# Patient Record
Sex: Female | Born: 1950 | Race: White | Hispanic: No | Marital: Married | Smoking: Never smoker
Health system: Southern US, Community
[De-identification: ages and names within clinical notes are randomized; demographics above are authoritative.]

## PROBLEM LIST (undated history)

## (undated) DIAGNOSIS — T4145XA Adverse effect of unspecified anesthetic, initial encounter: Secondary | ICD-10-CM

## (undated) DIAGNOSIS — J189 Pneumonia, unspecified organism: Secondary | ICD-10-CM

## (undated) DIAGNOSIS — I499 Cardiac arrhythmia, unspecified: Secondary | ICD-10-CM

## (undated) DIAGNOSIS — H04123 Dry eye syndrome of bilateral lacrimal glands: Secondary | ICD-10-CM

## (undated) DIAGNOSIS — G4725 Circadian rhythm sleep disorder, jet lag type: Secondary | ICD-10-CM

## (undated) DIAGNOSIS — I4891 Unspecified atrial fibrillation: Secondary | ICD-10-CM

## (undated) HISTORY — PX: TONSILLECTOMY: SUR1361

## (undated) HISTORY — DX: Unspecified atrial fibrillation: I48.91

## (undated) HISTORY — PX: LITHOTRIPSY: SUR834

## (undated) HISTORY — DX: Circadian rhythm sleep disorder, jet lag type: G47.25

---

## 1997-09-03 ENCOUNTER — Encounter: Admission: RE | Admit: 1997-09-03 | Discharge: 1997-09-03 | Payer: Self-pay | Admitting: Family Medicine

## 1998-01-28 ENCOUNTER — Encounter: Admission: RE | Admit: 1998-01-28 | Discharge: 1998-01-28 | Payer: Self-pay | Admitting: Family Medicine

## 2001-09-12 ENCOUNTER — Other Ambulatory Visit: Admission: RE | Admit: 2001-09-12 | Discharge: 2001-09-12 | Payer: Self-pay | Admitting: Obstetrics and Gynecology

## 2002-02-01 DIAGNOSIS — T8859XA Other complications of anesthesia, initial encounter: Secondary | ICD-10-CM

## 2002-02-01 HISTORY — DX: Other complications of anesthesia, initial encounter: T88.59XA

## 2002-09-18 ENCOUNTER — Other Ambulatory Visit: Admission: RE | Admit: 2002-09-18 | Discharge: 2002-09-18 | Payer: Self-pay | Admitting: Obstetrics and Gynecology

## 2003-09-26 ENCOUNTER — Other Ambulatory Visit: Admission: RE | Admit: 2003-09-26 | Discharge: 2003-09-26 | Payer: Self-pay | Admitting: Obstetrics and Gynecology

## 2004-10-14 ENCOUNTER — Other Ambulatory Visit: Admission: RE | Admit: 2004-10-14 | Discharge: 2004-10-14 | Payer: Self-pay | Admitting: Obstetrics and Gynecology

## 2005-10-20 ENCOUNTER — Other Ambulatory Visit: Admission: RE | Admit: 2005-10-20 | Discharge: 2005-10-20 | Payer: Self-pay | Admitting: Obstetrics and Gynecology

## 2006-08-11 ENCOUNTER — Ambulatory Visit: Payer: Self-pay | Admitting: Sports Medicine

## 2008-10-04 ENCOUNTER — Ambulatory Visit: Payer: Self-pay | Admitting: Family Medicine

## 2008-10-04 DIAGNOSIS — G4725 Circadian rhythm sleep disorder, jet lag type: Secondary | ICD-10-CM

## 2008-10-04 HISTORY — DX: Circadian rhythm sleep disorder, jet lag type: G47.25

## 2009-09-21 ENCOUNTER — Emergency Department (HOSPITAL_COMMUNITY): Admission: EM | Admit: 2009-09-21 | Discharge: 2009-09-21 | Payer: Self-pay | Admitting: Family Medicine

## 2009-12-01 ENCOUNTER — Encounter (INDEPENDENT_AMBULATORY_CARE_PROVIDER_SITE_OTHER): Payer: Self-pay | Admitting: *Deleted

## 2009-12-04 ENCOUNTER — Telehealth: Payer: Self-pay | Admitting: Gastroenterology

## 2010-01-02 ENCOUNTER — Encounter (INDEPENDENT_AMBULATORY_CARE_PROVIDER_SITE_OTHER): Payer: Self-pay | Admitting: *Deleted

## 2010-01-06 ENCOUNTER — Ambulatory Visit: Payer: Self-pay | Admitting: Gastroenterology

## 2010-01-20 ENCOUNTER — Ambulatory Visit: Payer: Self-pay | Admitting: Gastroenterology

## 2010-03-03 NOTE — Letter (Signed)
Summary: Pre Visit Letter Revised  [facility]  [address] [place], [place] [address]   Phone: [phone]  Fax: [phone]        12/01/2009 MRN: [id number] Yvonne Wheeler [address] [place], [place]  [address]             Procedure Date:  01-20-10   Welcome to the Gastroenterology Division at [facility].    You are scheduled to see a nurse for your pre-procedure visit on 01-06-10 at 1:30P.M. on the 3rd floor at [facility], [address]  We ask that you try to arrive at our office 15 minutes prior to your appointment time to allow for check-in.  Please take a minute to review the attached form.  If you answer "Yes" to one or more of the questions on the first page, we ask that you call the person listed at your earliest opportunity.  If you answer "No" to all of the questions, please complete the rest of the form and bring it to your appointment.    Your nurse visit will consist of discussing your medical and surgical history, your immediate family medical history, and your medications.   If you are unable to list all of your medications on the form, please bring the medication bottles to your appointment and we will list them.  We will need to be aware of both prescribed and over the counter drugs.  We will need to know exact dosage information as well.    Please be prepared to read and sign documents such as consent forms, a financial agreement, and acknowledgement forms.  If necessary, and with your consent, a friend or relative is welcome to sit-in on the nurse visit with you.  Please bring your insurance card so that we may make a copy of it.  If your insurance requires a referral to see a specialist, please bring your referral form from your primary care physician.  No co-pay is required for this nurse visit.     If you cannot keep your appointment, please call [phone] to cancel or reschedule prior to your appointment date.  This allows [place]  the opportunity to schedule an appointment for another patient in need of care.    Thank you for choosing [facility] for your medical needs.  We appreciate the opportunity to care for you.  Please visit [place] at our website  to learn more about our practice.  Sincerely, The Gastroenterology Division

## 2010-03-03 NOTE — Assessment & Plan Note (Signed)
Summary: cpe,tcb   Vital Signs:  Patient profile:   60 year old female Height:      65.5 inches Weight:      173.9 pounds BMI:     28.60 Pulse rate:   88 / minute BP sitting:   120 / 70  (left arm)  Vitals Entered By: Arlyss Repress CMA, (October 04, 2008 3:58 PM) CC: physical.meet new doctor.  last T-dap 08-11-2006. last pap 10-10 (goes to gyn office) request RX for motion sickness and sleeping. going on a trip [name] Is Patient Diabetic? No Pain Assessment Patient in pain? no        CC:  physical.meet new doctor.  last T-dap 08-11-2006. last pap 10-10 (goes to gyn office) request RX for motion sickness and sleeping. going on a trip [name].  History of Present Illness: Yvonne Wheeler comes in today to discuss anticipated need of anti-emetics for upcoming airline travel, as well as jet lag.  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  She has motion sickness and gets sick on planes and boats/ships.  She notes that otc dramamine sometimes does not suffice.  She denies fear of flying or panic.  She also is concerned about the jet lag. Will have one day in [name] during layover before flying to [name].  Wants something to help her sleep on the plane to [name].   Has not had colon cancer screening. Gets PAP and mammography done with her Gyn, [name].  Last PAP Oct 2009, scheduled to go back for gyn visit this October. Mammogram in July, scheduled for another one in Jan 2011.  Family Hx: No family hx breast or colon cancer.   ROS: No change in bowel habits, no blood or mucus in stool. No weight loss or fevers/chills.    Habits & Providers  Alcohol-Tobacco-Diet     Tobacco Status: never  Social History: Smoking Status:  never  Physical Exam  General:  Well-developed,well-nourished,in no acute distress; alert,appropriate and cooperative throughout examination Ears:   External ear exam shows no significant lesions or deformities.  Otoscopic examination reveals clear canals, tympanic membranes are intact bilaterally without bulging, retraction, inflammation or discharge. Hearing is grossly normal bilaterally. Mouth:  Oral mucosa and oropharynx without lesions or exudates.  Teeth in good repair. Neck:  No deformities, masses, or tenderness noted. Lungs:  Normal respiratory effort, chest expands symmetrically. Lungs are clear to auscultation, no crackles or wheezes. Heart:  Normal rate and regular rhythm. S1 and S2 normal without gallop, murmur, click, rub or other extra sounds.   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 Also, motion sickness.  She may apply a scopolamine patch before leaving, then remove at end of flight.  If despite the patch she has trouble initiating sleep, she may use Halcion to help initiate sleep on the plane.   Orders: [facility]- Est Level  3 ([address])  Problem # 2:  HEALTH SCREENING (ICD-V70.0) Discussed and recommended colon cancer screening.  She agrees she needs to do this, is a Engineer, site with [employer] and plans tomake her own appt during Christmas break in December.  She is to come in for fasting lipids when convenient to her.  Future Orders: Lipid-[facility] ([address]-10272) ... 10/03/2009  Complete Medication List: 1)  Meclizine Hcl 25 Mg Tabs (Meclizine  hcl) .... Sig: take 1 tab by mouth every 6 hours as needed for nausea 2)  Halcion 0.25 Mg Tabs (Triazolam) .... Sig: take 1 tab by mouth at bedtime as needed for insomnia  Patient Instructions: 1)  It was a pleasure to meet you today.  2)  For your sleep issues, I am refilling your Restoril 15mg  tablets; try to wean from 30mg  to 15mg  at night to initiate sleep.  3)  If you awaken during the night, you may take a Sonata 5mg  capsule to help you go back to sleep.  4)   You may benefit from Cognitive Behavioral Therapy (CBT) to help control your sleep pattern better without medication.  5)  Please return the Release of Information form with Dr. Earlene Plater' contact information, so that we may get labs and prior office notes from your doctor in [place].  6)  I would like to know how your regimen is going; Please call the office to discuss, or make an appointment to follow up in the coming month. Prescriptions: HALCION 0.25 MG TABS (TRIAZOLAM) SIG: Take 1 tab by mouth at bedtime as needed for insomnia  #6 x 0   Entered and Authorized by:   Paula Compton MD   Signed by:   Paula Compton MD on 10/04/2008   Method used:   Print then Give to Patient   RxID:   [id number] MECLIZINE HCL 25 MG TABS (MECLIZINE HCL) SIG: Take 1 tab by mouth every 6 hours as needed for nausea  #6 x 0   Entered and Authorized by:   Paula Compton MD   Signed by:   Paula Compton MD on 10/04/2008   Method used:   Electronically to        [facility] (retail)       [address]       [place], [place]  [address]       Ph: [phone] or [phone]       Fax: [phone]   RxID:   [id number]

## 2010-03-03 NOTE — Assessment & Plan Note (Signed)
Summary: np inactive husband sees Jaden Abreu health assessment form wp   Vital Signs:  Patient Profile:   60 Years Old Female Height:     65.5 inches Weight:      168 pounds BMI:     27.63 Pulse rate:   97 / minute BP sitting:   129 / 73  Pt. in pain?   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 slightly overweight Head:     Normocephalic and atraumatic without obvious abnormalities. No apparent alopecia or balding. Lungs:     Normal respiratory effort, chest expands symmetrically. Lungs are clear to auscultation, no crackles or wheezes. Heart:     Normal rate and regular rhythm. S1 and S2 normal without gallop, murmur, click, rub or other extra sounds. Msk:     no joint swelling and good movemtent and ROM of all joints    Impression & Recommendations:  Problem # 1:  Preventive Health Care (ICD-V70.0) Assessment: New encouraged more regular physical activity - shoot for 30 mins 5 days per week of aerobic acticity  school work form was completed witjh no medical limitations noted.  Other Orders: Tdap => 31yrs IM ([address]) Admin 1st Vaccine ([address]) [facility]- Est Level  2 ([address])         Tetanus/Td Vaccine    Vaccine Type: Tdap    Site: left deltoid    Mfr: sanofi pasteur    Dose: 0.5 ml    Route: IM    Given by: Arlyss Repress CMA,    Exp. Date: 10/11/2008    Lot #: [id number]    VIS given: 08/12/04 version given August 11, 2006.

## 2010-03-03 NOTE — Letter (Signed)
Summary: Moviprep Instructions  [facility]  [address]   [place], [place] [address]   Phone: [phone]  Fax: [phone]       Yvonne Wheeler    1950/11/16    MRN: [id number]        Procedure Day Dorna Bloom: Tuesday, 01-20-10     Arrival Time: 7:30 a.m.      Procedure Time: 8:30 a.m.     Location of Procedure:                      x  [facility] (4th Floor)                        PREPARATION FOR COLONOSCOPY WITH MOVIPREP   Starting 5 days prior to your procedure 01-15-10 do not eat nuts, seeds, popcorn, corn, beans, peas,  salads, or any raw vegetables.  Do not take any fiber supplements (e.g. Metamucil, Citrucel, and Benefiber).  THE DAY BEFORE YOUR PROCEDURE         DATE: 01-19-10   DAY: Monday  1.  Drink clear liquids the entire day-NO SOLID FOOD  2.  Do not drink anything colored red or purple.  Avoid juices with pulp.  No orange juice.  3.  Drink at least 64 oz. (8 glasses) of fluid/clear liquids during the day to prevent dehydration and help the prep work efficiently.  CLEAR LIQUIDS INCLUDE: Water Jello Ice Popsicles Tea (sugar ok, no milk/cream) Powdered fruit flavored drinks Coffee (sugar ok, no milk/cream) Gatorade Juice: apple, white grape, white cranberry  Lemonade Clear bullion, consomm, broth Carbonated beverages (any kind) Strained chicken noodle soup Hard Candy                             4.  In the morning, mix first dose of MoviPrep solution:    Empty 1 Pouch A and 1 Pouch B into the disposable container    Add lukewarm drinking water to the top line of the container. Mix to dissolve    Refrigerate (mixed solution should be used within 24 hrs)  5.  Begin drinking the prep at 5:00 p.m. The MoviPrep container is divided by 4 marks.   Every 15 minutes drink the solution down to the next mark (approximately 8 oz) until the full liter is complete.   6.  Follow completed prep with 16 oz of clear liquid of your choice  (Nothing red or purple).  Continue to drink clear liquids until bedtime.  7.  Before going to bed, mix second dose of MoviPrep solution:    Empty 1 Pouch A and 1 Pouch B into the disposable container    Add lukewarm drinking water to the top line of the container. Mix to dissolve    Refrigerate  THE DAY OF YOUR PROCEDURE      DATE: 01-20-10  DAY: Tuesday  Beginning at 3:30 a.m. (5 hours before procedure):         1. Every 15 minutes, drink the solution down to the next mark (approx 8 oz) until the full liter is complete.  2. Follow completed prep with 16 oz. of clear liquid of your choice.    3. You may drink clear liquids until 6:30 a.m. (2 HOURS BEFORE PROCEDURE).   MEDICATION INSTRUCTIONS  Unless otherwise instructed, you should take regular prescription medications with a small sip of water   as early as  possible the morning of your procedure.           OTHER INSTRUCTIONS  You will need a responsible adult at least 59 years of age to accompany you and drive you home.   This person must remain in the waiting room during your procedure.  Wear loose fitting clothing that is easily removed.  Leave jewelry and other valuables at home.  However, you may wish to bring a book to read or  an iPod/MP3 player to listen to music as you wait for your procedure to start.  Remove all body piercing jewelry and leave at home.  Total time from sign-in until discharge is approximately 2-3 hours.  You should go home directly after your procedure and rest.  You can resume normal activities the  day after your procedure.  The day of your procedure you should not:   Drive   Make legal decisions   Operate machinery   Drink alcohol   Return to work  You will receive specific instructions about eating, activities and medications before you leave.    The above instructions have been reviewed and explained to me by   Karl Bales RN  January 06, 2010 2:04 PM    I  fully understand and can verbalize these instructions _____________________________ Date _________

## 2010-03-03 NOTE — Miscellaneous (Signed)
Summary: [facility] previsit  Clinical Lists Changes  Medications: Added new medication of MOVIPREP 100 GM  SOLR (PEG-KCL-NACL-NASULF-NA ASC-C) As per prep instructions. - Signed Rx of MOVIPREP 100 GM  SOLR (PEG-KCL-NACL-NASULF-NA ASC-C) As per prep instructions.;  #1 x 0;  Signed;  Entered by: Karl Bales RN;  Authorized by: Meryl Dare MD [facility];  Method used: Electron[id number] [address] [place], [place]  [address], Ph: [phone] or [phone], Fax: [phone] Allergies: Added new allergy or adverse reaction of CODEINE Added new allergy or adverse reaction of SULFA Observations: Added new observation of NKA: F (01/06/2010 13:16)    Prescriptions: MOVIPREP 100 GM  SOLR (PEG-KCL-NACL-NASULF-NA ASC-C) As per prep instructions.  #1 x 0   Entered by:   Karl Bales RN   Authorized by:   Meryl Dare MD [facility]   Signed by:   Karl Bales RN on 01/06/2010   Method used:   Electronically to        [facility] ([facility]       [address]       [place], [place]  [address]       Ph: [phone] or [phone]       Fax: [phone]   RxID:   [id number]

## 2010-03-03 NOTE — Progress Notes (Signed)
Summary: previsit letter ?'s  Phone Note Call from Patient Call back at Home Phone [phone]   Caller: Patient Call For: Dr. Russella Dar Reason for Call: Talk to Nurse Summary of Call: previsit letter ?'s Initial call taken by: Vallarie Mare,  December 04, 2009 8:32 AM  Follow-up for Phone Call        Pt states she answered yes to question about being sensitive or allergic to anesthesia. Pt states she had lithotripsy done years ago and cannot remember what sedation she had for this but her husband told her that she was really hard to wake up after the procedure. She states she did not have a allergic reaction to the medicine but felt tired even the next day after the procedure. Told pt to let nurse know at previsit so they can make a note of it on her chart the day of her procedure but she can still have the procedure directly. Pt agreed and states she will tell the previsit nurse before the procedure.  Follow-up by: Christie Nottingham CMA Duncan Dull),  December 04, 2009 1:56 PM

## 2010-03-05 NOTE — Procedures (Signed)
Summary: Colonoscopy  Patient: [name] Note: All result statuses are Final unless otherwise noted.  Tests: (1) Colonoscopy (COL)   COL Colonoscopy           DONE     [facility]     [address]     [place], [place]  [address]           COLONOSCOPY PROCEDURE REPORT           PATIENT:  Yvonne Wheeler, Yvonne Wheeler  MR#:  [id number]     BIRTHDATE:  Jun 22, 1950, 59 yrs. old  GENDER:  female     ENDOSCOPIST:  Judie Petit T. Russella Dar, MD, [facility]     Referred by:  Huel Cote, M.D.     PROCEDURE DATE:  01/20/2010     PROCEDURE:  Colonoscopy [address]     ASA CLASS:  Class II     INDICATIONS:  1) Routine Risk Screening     MEDICATIONS:   Fentanyl 50 mcg IV, Versed 7 mg IV     DESCRIPTION OF PROCEDURE:   After the risks benefits and     alternatives of the procedure were thoroughly explained, informed     consent was obtained.  Digital rectal exam was performed and     revealed no abnormalities.   The LB [id number] endoscope     was introduced through the anus and advanced to the cecum, which     was identified by both the appendix and ileocecal valve, without     limitations.  The quality of the prep was excellent, using     MoviPrep.  The instrument was then slowly withdrawn as the colon     was fully examined.     <<PROCEDUREIMAGES>>     FINDINGS:  A normal appearing cecum, ileocecal valve, and     appendiceal orifice were identified. The ascending, hepatic     flexure, transverse, splenic flexure, descending, sigmoid colon,     and rectum appeared unremarkable.   Retroflexed views in the     rectum revealed no abnormalities.  The time to cecum =  1.67     minutes. The scope was then withdrawn (time =  9  min) from the     patient and the procedure completed.           COMPLICATIONS:  None           ENDOSCOPIC IMPRESSION:     1) Normal colon           RECOMMENDATIONS:     1) Continue current colorectal screening for "routine risk"     patients with a repeat colonoscopy in 10  years.           Venita Lick. Russella Dar, MD, [facility]           n.     eSIGNED:   Venita Lick. Stark at 01/20/2010 08:59 AM           Dalia Heading, [phone]  Note: An exclamation mark (!) indicates a result that was not dispersed into the flowsheet. Document Creation Date: 01/20/2010 9:00 AM _______________________________________________________________________  (1) Order result status: Final Collection or observation date-time: 01/20/2010 08:55 Requested date-time:  Receipt date-time:  Reported date-time:  Referring Physician:   Ordering Physician: Claudette Head [phone]) Specimen Source:  Source: Launa Grill Order Number: [id number] Lab site:   Appended Document: Colonoscopy    Clinical Lists Changes  Observations: Added new observation of COLONNXTDUE: 01/2020 (01/20/2010 9:03)

## 2010-03-27 ENCOUNTER — Encounter: Payer: Self-pay | Admitting: *Deleted

## 2010-04-17 LAB — POCT URINALYSIS DIPSTICK
Bilirubin Urine: NEGATIVE
Glucose, UA: NEGATIVE mg/dL
Ketones, ur: NEGATIVE mg/dL
Nitrite: NEGATIVE
Protein, ur: NEGATIVE mg/dL
Specific Gravity, Urine: 1.01 (ref 1.005–1.030)
Urobilinogen, UA: 0.2 mg/dL (ref 0.0–1.0)
pH: 6.5 (ref 5.0–8.0)

## 2011-04-24 ENCOUNTER — Encounter (HOSPITAL_COMMUNITY): Payer: Self-pay | Admitting: Emergency Medicine

## 2011-04-24 ENCOUNTER — Emergency Department (HOSPITAL_COMMUNITY)
Admission: EM | Admit: 2011-04-24 | Discharge: 2011-04-24 | Disposition: A | Discharge status: Home / Self Care | Payer: BC Managed Care – PPO | Attending: Emergency Medicine | Admitting: Emergency Medicine

## 2011-04-24 DIAGNOSIS — H109 Unspecified conjunctivitis: Secondary | ICD-10-CM

## 2011-04-24 HISTORY — DX: Dry eye syndrome of bilateral lacrimal glands: H04.123

## 2011-04-24 MED ORDER — ERYTHROMYCIN 5 MG/GM OP OINT: TOPICAL_OINTMENT | Freq: Three times a day (TID) | OPHTHALMIC | Status: AC

## 2011-04-24 MED ORDER — ERYTHROMYCIN 5 MG/GM OP OINT
TOPICAL_OINTMENT | Freq: Three times a day (TID) | OPHTHALMIC | Status: DC
  Administered 2011-04-24: 23:00:00 via OPHTHALMIC
  Filled 2011-04-24: qty 3.5

## 2011-04-24 NOTE — ED Notes (Addendum)
Pt with chronic dry eyes presents to ED with redness to right sclera, yellow/green exudate that started today; slightly blurred vision in right eye

## 2011-04-24 NOTE — ED Provider Notes (Signed)
History     CSN: [id number]  Arrival date & time 04/24/11  2024   First MD Initiated Contact with Patient 04/24/11 2217      Chief Complaint  Patient presents with  . Eye Problem    (Consider location/radiation/quality/duration/timing/severity/associated sxs/prior treatment) HPI Comments: Patient here with right eye redness and drainage with mild pain - states no photophobia - no history of anyone else with infection.  Patient with a history of dry eyes who is on restasis - states has had a remote history of infection in the eye in the past.  See opthamology for the dry eyes.  Patient is a 61 y.o. female presenting with eye problem. The history is provided by the patient. No language interpreter was used.  Eye Problem  This is a new problem. The current episode started 6 to 12 hours ago. The problem occurs constantly. The problem has not changed since onset.There is pain in the right eye. The pain is at a severity of 3/10. The pain is mild. There is no history of trauma to the eye. There is no known exposure to pink eye. She does not wear contacts. Associated symptoms include discharge, eye redness and itching. Pertinent negatives include no numbness, no blurred vision, no decreased vision, no double vision, no foreign body sensation, no photophobia, no nausea, no vomiting, no tingling and no weakness. She has tried nothing for the symptoms. The treatment provided no relief.    Past Medical History  Diagnosis Date  . Dry eyes     Past Surgical History  Procedure Date  . Tonsillectomy     No family history on file.  History  Substance Use Topics  . Smoking status: Never Smoker   . Smokeless tobacco: Not on file  . Alcohol Use: Yes     occassional    OB History    Grav Para Term Preterm Abortions TAB SAB Ect Mult Living                  Review of Systems  Eyes: Positive for pain, discharge, redness and itching. Negative for blurred vision, double vision, photophobia and  visual disturbance.  Gastrointestinal: Negative for nausea and vomiting.  Skin: Positive for itching.  Neurological: Negative for tingling, weakness and numbness.  All other systems reviewed and are negative.    Allergies  Codeine and Sulfonamide derivatives  Home Medications   Current Outpatient Rx  Name Route Sig Dispense Refill  . CYCLOSPORINE 0.05 % OP EMUL Both Eyes Place 1 drop into both eyes every 12 (twelve) hours.    . ESTRADIOL 25 MCG VA TABS Vaginal Place 25 mcg vaginally 2 (two) times a week.    . IBUPROFEN 200 MG PO TABS Oral Take 400 mg by mouth every 6 (six) hours as needed. For pain    . ADULT MULTIVITAMIN W/MINERALS CH Oral Take 1 tablet by mouth daily.      BP 144/77  Pulse 98  Temp(Src) 98.8 F (37.1 C) (Oral)  Resp 18  SpO2 100%  Physical Exam  Nursing note and vitals reviewed. Constitutional: She is oriented to person, place, and time. She appears well-developed and well-nourished. No distress.  HENT:  Head: Normocephalic and atraumatic.  Right Ear: External ear normal.  Left Ear: External ear normal.  Nose: Nose normal.  Mouth/Throat: Oropharynx is clear and moist. No oropharyngeal exudate.  Eyes: EOM are normal. Pupils are equal, round, and reactive to light. Right eye exhibits discharge. Left eye exhibits no discharge.  No scleral icterus.  Neck: Normal range of motion. Neck supple.  Cardiovascular: Normal rate, regular rhythm and normal heart sounds.  Exam reveals no gallop and no friction rub.   No murmur heard. Pulmonary/Chest: Effort normal and breath sounds normal. No respiratory distress. She exhibits no tenderness.  Abdominal: Soft. Bowel sounds are normal. She exhibits no distension. There is no tenderness.  Musculoskeletal: Normal range of motion. She exhibits no edema and no tenderness.  Lymphadenopathy:    She has no cervical adenopathy.  Neurological: She is alert and oriented to person, place, and time. No cranial nerve deficit.    Skin: Skin is warm and dry. No rash noted. No erythema. No pallor.  Psychiatric: She has a normal mood and affect. Her behavior is normal. Judgment and thought content normal.    ED Course  Procedures (including critical care time)  Labs Reviewed - No data to display No results found.   Conjunctivitis    MDM  Patient with purulent drainage and crusting noted to the right conjunctiva - no pain except with gentle palpation of the eye - no fever, chills.  Has been on erythromycin in the past and will place her on this again.  She will follow up with her opthamologist.        Scarlette Calico C. [place], [place] 04/24/11 2303

## 2011-04-24 NOTE — ED Provider Notes (Signed)
Medical screening examination/treatment/procedure(s) were performed by non-physician practitioner and as supervising physician I was immediately available for consultation/collaboration.   Rolan Bucco, MD 04/24/11 [phone]

## 2011-04-24 NOTE — Discharge Instructions (Signed)
Conjunctivitis Conjunctivitis is commonly called "pink eye." Conjunctivitis can be caused by bacterial or viral infection, allergies, or injuries. There is usually redness of the lining of the eye, itching, discomfort, and sometimes discharge. There may be deposits of matter along the eyelids. A viral infection usually causes a watery discharge, while a bacterial infection causes a yellowish, thick discharge. Pink eye is very contagious and spreads by direct contact. You may be given antibiotic eyedrops as part of your treatment. Before using your eye medicine, remove all drainage from the eye by washing gently with warm water and cotton balls. Continue to use the medication until you have awakened 2 mornings in a row without discharge from the eye. Do not rub your eye. This increases the irritation and helps spread infection. Use separate towels from other household members. Wash your hands with soap and water before and after touching your eyes. Use cold compresses to reduce pain and sunglasses to relieve irritation from light. Do not wear contact lenses or wear eye makeup until the infection is gone. SEEK MEDICAL CARE IF:   Your symptoms are not better after 3 days of treatment.   You have increased pain or trouble seeing.   The outer eyelids become very red or swollen.  Document Released: 02/26/2004 Document Revised: 01/07/2011 Document Reviewed: 01/18/2005 Exit[employer] Patient Information 2012 [employer]. 

## 2012-07-17 ENCOUNTER — Ambulatory Visit (INDEPENDENT_AMBULATORY_CARE_PROVIDER_SITE_OTHER): Payer: BC Managed Care – PPO | Admitting: Sports Medicine

## 2012-07-17 ENCOUNTER — Ambulatory Visit
Admission: RE | Admit: 2012-07-17 | Discharge: 2012-07-17 | Disposition: A | Discharge status: Home / Self Care | Payer: BC Managed Care – PPO | Source: Ambulatory Visit | Attending: Sports Medicine | Admitting: Sports Medicine

## 2012-07-17 ENCOUNTER — Encounter: Payer: Self-pay | Admitting: Sports Medicine

## 2012-07-17 ENCOUNTER — Other Ambulatory Visit: Payer: Self-pay | Admitting: Sports Medicine

## 2012-07-17 VITALS — BP 114/77 | HR 97 | Ht 65.5 in | Wt 190.0 lb

## 2012-07-17 DIAGNOSIS — M25569 Pain in unspecified knee: Secondary | ICD-10-CM

## 2012-07-17 DIAGNOSIS — M25561 Pain in right knee: Secondary | ICD-10-CM

## 2012-07-17 NOTE — Progress Notes (Signed)
  Subjective:    Patient ID: Yvonne Wheeler, female    DOB: Dec 23, 1950, 62 y.o.   MRN: [id number]  HPI chief complaint: Right knee pain  Very pleasant 62 year old female comes in today complaining of about 6 weeks of right knee pain. She initially injured the knee while squatting and twisting. Felt a twinge at the time but developed pain and swelling the following day. Pain is sharp in quality and localized to the anterior medial knee. It is worse with weightbearing, particularly with twisting type motions. Denies prior problems with his knee. No prior knee surgeries. She gets painful popping. Pain improves slightly with over-the-counter anti-inflammatories and ice. She is concerned because she has a trip to [place] planned for this fall.  Past medical history and current medications are reviewed Allergies are reviewed Socially she does not smoke, drinks alcohol on an occasional basis, and works as a Geologist, engineering at [employer]     Review of Systems     Objective:   Physical Exam Well-developed, well-nourished. No acute distress. Awake alert and oriented x3. Vital signs are reviewed  Right knee: Range of motion 0-120. Trace to 1+ effusion. She is tender to palpation along the medial joint line with a positive McMurrays and a positive Thessaly's. Minimal patellofemoral crepitus. Good ligamentous stability. Neurovascularly intact distally. Walking with a slight limp.       Assessment & Plan:  1. Right knee pain likely secondary to medial meniscal tear  AP, lateral, and sunrise views of the right knee. MRI of the right knee specifically to rule out a medial meniscal tear which may need arthroscopic debridement. Continue with over-the-counter anti-inflammatories and ice until I reviewed her MRI We will delineate further treatment based on that study.

## 2012-07-17 NOTE — Patient Instructions (Addendum)
You have been scheduled for a MRI of your right knee on 07/19/12 at 12:15pm.  Please arrive at 11:45am to register. [facility] [address] [phone]

## 2012-07-19 ENCOUNTER — Ambulatory Visit
Admission: RE | Admit: 2012-07-19 | Discharge: 2012-07-19 | Disposition: A | Discharge status: Home / Self Care | Payer: BC Managed Care – PPO | Source: Ambulatory Visit | Attending: Sports Medicine | Admitting: Sports Medicine

## 2012-07-19 DIAGNOSIS — M25561 Pain in right knee: Secondary | ICD-10-CM

## 2012-07-21 ENCOUNTER — Telehealth: Payer: Self-pay | Admitting: Sports Medicine

## 2012-07-21 ENCOUNTER — Telehealth: Payer: Self-pay | Admitting: *Deleted

## 2012-07-21 NOTE — Telephone Encounter (Signed)
Message copied by Mora Bellman on Fri Jul 21, 2012  9:20 AM ------      Message from: Ralene Cork      Created: Fri Jul 21, 2012  8:34 AM      Regarding: referral       Please refer to Dr. Thurston Hole for a medial meniscal tear.            ----- Message -----         From: Rad Results In Interface         Sent: 07/19/2012   1:31 PM           To: Ralene Cork, DO                   ------

## 2012-07-21 NOTE — Telephone Encounter (Signed)
Scheduled pt for appt 07/25/12 at 10 am with Dr. Thurston Hole.  Pt notified of appt info.

## 2012-07-21 NOTE — Telephone Encounter (Signed)
I spoke with the patient on the phone regarding MRI findings of her right knee. MRI shows a moderate size tear to the posterior horn of the medial meniscus and a moderate-sized joint effusion. She has mild to moderate degenerative changes. Given her swelling and mechanical symptoms I recommended referral to Dr. Thurston Hole to discuss further treatment options. He very well may start with a cortisone injection but she has a trip planned for this fall and she would be interested in arthroscopy if he feels like that is warranted. I'll defer further treatment to the discretion of Dr. Thurston Hole. Followup with me when necessary.

## 2012-08-01 HISTORY — PX: MENISCUS REPAIR: SHX5179

## 2012-08-31 ENCOUNTER — Encounter (INDEPENDENT_AMBULATORY_CARE_PROVIDER_SITE_OTHER): Payer: BC Managed Care – PPO

## 2012-08-31 DIAGNOSIS — M79609 Pain in unspecified limb: Secondary | ICD-10-CM

## 2012-08-31 DIAGNOSIS — R609 Edema, unspecified: Secondary | ICD-10-CM

## 2012-08-31 DIAGNOSIS — M7989 Other specified soft tissue disorders: Secondary | ICD-10-CM

## 2012-09-14 ENCOUNTER — Other Ambulatory Visit: Payer: Self-pay | Admitting: Orthopedic Surgery

## 2012-09-14 ENCOUNTER — Ambulatory Visit
Admission: RE | Admit: 2012-09-14 | Discharge: 2012-09-14 | Disposition: A | Discharge status: Home / Self Care | Payer: BC Managed Care – PPO | Source: Ambulatory Visit | Attending: Orthopedic Surgery | Admitting: Orthopedic Surgery

## 2012-09-14 DIAGNOSIS — R059 Cough, unspecified: Secondary | ICD-10-CM

## 2012-09-14 DIAGNOSIS — R05 Cough: Secondary | ICD-10-CM

## 2012-09-14 DIAGNOSIS — R0602 Shortness of breath: Secondary | ICD-10-CM

## 2012-09-14 MED ORDER — IOHEXOL 350 MG/ML SOLN
125.0000 mL | Freq: Once | INTRAVENOUS | Status: AC | PRN
  Administered 2012-09-14: 125 mL via INTRAVENOUS

## 2012-09-28 ENCOUNTER — Other Ambulatory Visit: Payer: Self-pay | Admitting: Family Medicine

## 2012-09-28 DIAGNOSIS — J189 Pneumonia, unspecified organism: Secondary | ICD-10-CM

## 2012-10-02 DIAGNOSIS — J189 Pneumonia, unspecified organism: Secondary | ICD-10-CM

## 2012-10-02 HISTORY — DX: Pneumonia, unspecified organism: J18.9

## 2012-10-04 ENCOUNTER — Ambulatory Visit
Admission: RE | Admit: 2012-10-04 | Discharge: 2012-10-04 | Disposition: A | Discharge status: Home / Self Care | Payer: BC Managed Care – PPO | Source: Ambulatory Visit | Attending: Family Medicine | Admitting: Family Medicine

## 2012-10-04 DIAGNOSIS — J189 Pneumonia, unspecified organism: Secondary | ICD-10-CM

## 2012-10-09 ENCOUNTER — Other Ambulatory Visit: Payer: Self-pay | Admitting: Family Medicine

## 2012-10-09 DIAGNOSIS — I82409 Acute embolism and thrombosis of unspecified deep veins of unspecified lower extremity: Secondary | ICD-10-CM

## 2012-10-11 ENCOUNTER — Ambulatory Visit
Admission: RE | Admit: 2012-10-11 | Discharge: 2012-10-11 | Disposition: A | Discharge status: Home / Self Care | Payer: BC Managed Care – PPO | Source: Ambulatory Visit | Attending: Family Medicine | Admitting: Family Medicine

## 2012-10-11 DIAGNOSIS — I82409 Acute embolism and thrombosis of unspecified deep veins of unspecified lower extremity: Secondary | ICD-10-CM

## 2012-10-16 ENCOUNTER — Other Ambulatory Visit: Payer: Self-pay | Admitting: Cardiology

## 2012-10-17 NOTE — H&P (Signed)
Patient: Yvonne Wheeler, Yvonne Wheeler Account Number: [id number] Provider: Donato Schultz, MD  DOB: 10/19/1950 Age: 62 Y Sex: Female Date: 10/13/2012  Phone: [phone]   Address: [address], [place], [place]  Pcp: [name] MILLER          1. REF DR Hyacinth Meeker EVALUATE NEW ONSET AFIB.        HPI:  General:  62 year old here to evaluate new onset atrial fibrillation. She is traveling to [place] on 10/24/12. Had recent knee surgery and a DVT. CT scan was performed to exclude pulmonary embolism. During followup exam, on 09/25/12, an EKG was performed which demonstrated atrial fibrillation. Rate 108 noted. On Xarelto 20mg  now. Been on Xarelto since July 31 st. More than one month. Walking PNA. Finished another round of abx. Cough. Mild SOB.  Since Dr. Hyacinth Meeker told her about AFIB she can feel it skipping. Prior did not notice it. No CP. No thyroid history. No tob. Very mild EtOH. .        ROS:  Denies any fevers, chills, orthopnea, PND, rashes, strokelike symptoms, bleeding, chest pain. Unless specified above, all other review of systems negative.       Medical History: DVT after surgery Wainer 08/31/12 R peroneal vein Xarelto, 3 mos , nl u/s 10/11/12, Xarelto until 12/01/12 , colonoscopy 03/2009 Dr. Corinda [place] normal, Pneumonia on CT 09/14/12 lat inf R middle lobe, needs repeat CT chest without contrast , A fib found at OV 09/25/12, Obesity.        Gyn History:  Last pap smear date 03/2012 Dr. Senaida Ores, normal.  Last mammogram date 09/2012 [facility], normal.  Other: bone density 09/2011 [facility], normal.        Surgical History: tonsillectomy , lithotripsy , 2 live births , torn meniscus (one month ago) .        Family History: Father: deceased, old ageMother: deceased, old ageBrother 1: 59 yrs, none knownChildren: two children 6, 24 no issuesNon-Contributory       Social History:  General:  Tobacco use  cigarettes: Never smoked no Smoking.  no Tobacco Exposure.  Alcohol: yes, 2-3 per month.  Caffeine:  yes.  no Recreational drug use.  no Diet.  Exercise: yes.  Occupation: retired Runner, broadcasting/film/video.        Medications: Taking Restasis 0.05 % Emulsion 1 into affected eye Twice a day, Taking Multi For Her Tablet , Taking Tylenol 500 MG/15ML Liquid 1 tablet as needed every 6 hrs, Taking Ambien 10 MG Tablet 1 tablet at bedtime Once a day prn insomnia, Taking Metoprolol Succinate 25 MG Tablet Extended Release 24 Hour 1 tablet Once a day, Taking Xarelto 20 mg . tablet 1 tablet once a day, Notes: Dr. Thurston Hole, Discontinued Benzonatate 200 MG Capsule 1 capsule as needed Q 8hrs prn cough, Discontinued Levaquin 750 MG Tablet 1 tablet Once a day, Medication List reviewed and reconciled with the patient       Allergies: Codeine (for allergy): vomiting for 3 days, Sulfa Drugs: rash.           Vitals: Wt 194.8, Wt change -1.6 lb, Ht 65.5, BMI 31.92, Pulse sitting 90, BP sitting 134/90.       Examination:  General Examination:  GENERAL APPEARANCE alert, oriented, NAD, pleasant.  SKIN: normal, no rash.  HEENT: normal.  HEAD: [place]/AT.  EYES: EOMI, Conjunctiva clear.  NECK: supple, FROM, without evidence of thyromegaly, adenopathy, or bruits, no jugular venous distention (JVD).  LUNGS: clear to auscultation bilaterally, no wheezes, rhonchi, rales, regular breathing rate and effort.  HEART: Irregularly irregular, no S3, S4, murmur or rub, point of maximul impulse (PMI) normal.  ABDOMEN: soft, non-tender/non-distended, bowel sounds present, no masses palpated, no bruit.  EXTREMITIES: no clubbing, no edema, pulses 2 plus bilaterally.  NEUROLOGIC EXAM: non-focal exam, alert and oriented x 3.  PERIPHERAL PULSES: normal (2+) bilaterally.  LYMPH NODES: no cervical adenopathy.  PSYCH affect normal.  Prior medical records, lab work on EKG reviewed.           Assessment:  1. Atrial fibrillation - 427.31 (Primary)  2. DVT - 453.40  3. Obesity - 278.00  4. Anticoagulant monitoring - V58.61        1. Atrial  fibrillation  LAB: PT (Prothrombin Time) (161096)     Stegall,Amy 10/16/2012 09:21:53 AM > ? McVey,Linda 10/16/2012 11:41:47 AM > scheduled for 10/16/2012   LAB: Basic Metabolic     Stegall,Amy 10/16/2012 09:22:08 AM > ? McVey,Linda 10/16/2012 11:41:20 AM > scheduled for 10/16/2012   LAB: CBC with Diff     Stegall,Amy 10/16/2012 09:21:24 AM > Bonita Quin, does she need labs? if so, call to r/s. McVey,Linda 10/16/2012 11:42:08 AM > scheduled for 10/16/2012    Imaging: EC Echocardiogram (Ordered for 10/13/2012) Notes: New-onset atrial fibrillation/newly discovered. She has been on anticoagulation, Xarelto, for greater than one month ( DVT treatment). I will perform cardioversion to help reestablish normal sinus rhythm. Risks and benefits of procedure have been discussed including pacemaker, worsening arrhythmia. Anesthesia will assist with procedure. There is a chance that atrial fibrillation will return after procedure. She seems to be tolerating the atrial fibrillation well, good rate control. I also explained that not only the surgery but recent bout of pneumonia can contribute to atrial fibrillation. Since she is getting better, no fevers etc, I feel comfortable proceeding with cardioversion at this time. She is very eager to go on her trip to [place]. I think that this would be reasonable. We will check echocardiogram as part of evaluation for newly discovered atrial fibrillation.       2. DVT  Notes: Treated with Xarelto.       3. Obesity  Notes: Continue to encourage weight loss.       4. Anticoagulant monitoring  Notes: It is important for her to continue with her anticoagulation after cardioversion for 4 weeks. Her CHADS-VAS score is one over female. Based upon this, she would not require long-term anticoagulation.        Preventive:   CC: Dr. Sigmund Hazel.       Follow Up: I will followup after cardioversion.

## 2012-10-18 ENCOUNTER — Ambulatory Visit (HOSPITAL_COMMUNITY): Payer: BC Managed Care – PPO | Admitting: *Deleted

## 2012-10-18 ENCOUNTER — Encounter (HOSPITAL_COMMUNITY)
Admission: RE | Disposition: A | Discharge status: Home / Self Care | Payer: Self-pay | Source: Ambulatory Visit | Attending: Cardiology

## 2012-10-18 ENCOUNTER — Encounter (HOSPITAL_COMMUNITY): Payer: Self-pay | Admitting: *Deleted

## 2012-10-18 ENCOUNTER — Ambulatory Visit (HOSPITAL_COMMUNITY)
Admission: RE | Admit: 2012-10-18 | Discharge: 2012-10-18 | Disposition: A | Discharge status: Home / Self Care | Payer: BC Managed Care – PPO | Source: Ambulatory Visit | Attending: Cardiology | Admitting: Cardiology

## 2012-10-18 ENCOUNTER — Encounter (HOSPITAL_COMMUNITY): Payer: Self-pay

## 2012-10-18 DIAGNOSIS — Z86718 Personal history of other venous thrombosis and embolism: Secondary | ICD-10-CM | POA: Insufficient documentation

## 2012-10-18 DIAGNOSIS — E669 Obesity, unspecified: Secondary | ICD-10-CM | POA: Insufficient documentation

## 2012-10-18 DIAGNOSIS — I4891 Unspecified atrial fibrillation: Secondary | ICD-10-CM

## 2012-10-18 DIAGNOSIS — Z7901 Long term (current) use of anticoagulants: Secondary | ICD-10-CM | POA: Insufficient documentation

## 2012-10-18 DIAGNOSIS — I480 Paroxysmal atrial fibrillation: Secondary | ICD-10-CM | POA: Diagnosis present

## 2012-10-18 HISTORY — PX: CARDIOVERSION: SHX1299

## 2012-10-18 HISTORY — DX: Unspecified atrial fibrillation: I48.91

## 2012-10-18 HISTORY — DX: Cardiac arrhythmia, unspecified: I49.9

## 2012-10-18 HISTORY — DX: Adverse effect of unspecified anesthetic, initial encounter: T41.45XA

## 2012-10-18 HISTORY — DX: Pneumonia, unspecified organism: J18.9

## 2012-10-18 SURGERY — CARDIOVERSION
Anesthesia: Monitor Anesthesia Care

## 2012-10-18 MED ORDER — PROPOFOL 10 MG/ML IV BOLUS
INTRAVENOUS | Status: DC | PRN
  Administered 2012-10-18: 60 mg via INTRAVENOUS

## 2012-10-18 MED ORDER — LIDOCAINE HCL (CARDIAC) 20 MG/ML IV SOLN
INTRAVENOUS | Status: DC | PRN
  Administered 2012-10-18: 20 mg via INTRAVENOUS

## 2012-10-18 MED ORDER — SODIUM CHLORIDE 0.9 % IV SOLN
INTRAVENOUS | Status: DC
  Administered 2012-10-18: 11:00:00 via INTRAVENOUS

## 2012-10-18 NOTE — CV Procedure (Signed)
Electrical Cardioversion Procedure Note Yvonne Wheeler [id number] [birth year]  Procedure: Electrical Cardioversion Indications:  Atrial Fibrillation  Time Out: Verified patient identification, verified procedure,medications/allergies/relevent history reviewed, required imaging and test results available.  Performed  Procedure Details  The patient was NPO after midnight. Anesthesia was administered at the beside  by Dr.Joslin with 60mg  of propofol.  Cardioversion was performed with synchronized biphasic defibrillation via AP pads with 150 joules.  1 attempt(s) were performed.  The patient converted to normal sinus rhythm. The patient tolerated the procedure well   IMPRESSION:  Successful cardioversion of atrial fibrillation  Xarelto, continue.   SKAINS, MARK 10/18/2012, 11:52 AM

## 2012-10-18 NOTE — Anesthesia Preprocedure Evaluation (Addendum)
Anesthesia Evaluation  Patient identified by MRN, date of birth, ID band Patient awake    Reviewed: Allergy & Precautions, H&P , NPO status , Patient's Chart, lab work & pertinent test results, reviewed documented beta blocker date and time   Airway Mallampati: II TM Distance: >3 FB Neck ROM: Full    Dental  (+) Teeth Intact and Dental Advisory Given   Pulmonary pneumonia -, resolved,  resolved breath sounds clear to auscultation        Cardiovascular + dysrhythmias Rhythm:Irregular     Neuro/Psych    GI/Hepatic   Endo/Other    Renal/GU      Musculoskeletal   Abdominal   Peds  Hematology   Anesthesia Other Findings   Reproductive/Obstetrics                          Anesthesia Physical Anesthesia Plan  ASA: III  Anesthesia Plan: MAC   Post-op Pain Management:    Induction: Intravenous  Airway Management Planned: Mask  Additional Equipment:   Intra-op Plan:   Post-operative Plan:   Informed Consent: I have reviewed the patients History and Physical, chart, labs and discussed the procedure including the risks, benefits and alternatives for the proposed anesthesia with the patient or authorized representative who has indicated his/her understanding and acceptance.   Dental advisory given  Plan Discussed with: CRNA, Anesthesiologist and Surgeon  Anesthesia Plan Comments:         Anesthesia Quick Evaluation

## 2012-10-18 NOTE — Preoperative (Signed)
Beta Blockers   Reason not to administer Beta Blockers:Not Applicable 

## 2012-10-18 NOTE — Transfer of Care (Signed)
Immediate Anesthesia Transfer of Care Note  Patient: Yvonne Wheeler  Procedure(s) Performed: Procedure(s): CARDIOVERSION (N/A)  Patient Location: Endoscopy Unit  Anesthesia Type:MAC  Level of Consciousness: awake, alert  and oriented  Airway & Oxygen Therapy: Patient Spontanous Breathing and Patient connected to nasal cannula oxygen  Post-op Assessment: Report given to PACU RN and Post -op Vital signs reviewed and stable  Post vital signs: Reviewed and stable  Complications: No apparent anesthesia complications

## 2012-10-18 NOTE — H&P (View-Only) (Signed)
Patient: Yvonne Wheeler, Yvonne Wheeler Account Number: [id number] Provider: Petina Muraski, MD  DOB: 11/13/1950 Age: 62 Y Sex: Female Date: 10/13/2012  Phone: [phone]   Address: [address], , [place]  Pcp: [name] MILLER          1. REF DR MILLER EVALUATE NEW ONSET AFIB.        HPI:  General:  62-year-old here to evaluate new onset atrial fibrillation. She is traveling to [place] on 10/24/12. Had recent knee surgery and a DVT. CT scan was performed to exclude pulmonary embolism. During followup exam, on 09/25/12, an EKG was performed which demonstrated atrial fibrillation. Rate 108 noted. On Xarelto 20mg now. Been on Xarelto since July 31 st. More than one month. Walking PNA. Finished another round of abx. Cough. Mild SOB.  Since Dr. Miller told her about AFIB she can feel it skipping. Prior did not notice it. No CP. No thyroid history. No tob. Very mild EtOH. .        ROS:  Denies any fevers, chills, orthopnea, PND, rashes, strokelike symptoms, bleeding, chest pain. Unless specified above, all other review of systems negative.       Medical History: DVT after surgery Wainer 08/31/12 Wheeler peroneal vein Xarelto, 3 mos , nl u/s 10/11/12, Xarelto until 12/01/12 , colonoscopy 03/2009 Dr. [place] normal, Pneumonia on CT 09/14/12 lat inf Wheeler middle lobe, needs repeat CT chest without contrast , A fib found at OV 09/25/12, Obesity.        Gyn History:  Last pap smear date 03/2012 Dr. Richardson, normal.  Last mammogram date 09/2012 [facility], normal.  Other: bone density 09/2011 [facility], normal.        Surgical History: tonsillectomy , lithotripsy , 2 live births , torn meniscus (one month ago) .        Family History: Father: deceased, old ageMother: deceased, old ageBrother 1: 59 yrs, none knownChildren: two children 32, 24 no issuesNon-Contributory       Social History:  General:  Tobacco use  cigarettes: Never smoked no Smoking.  no Tobacco Exposure.  Alcohol: yes, 2-3 per month.  Caffeine:  yes.  no Recreational drug use.  no Diet.  Exercise: yes.  Occupation: retired teacher.        Medications: Taking Restasis 0.05 % Emulsion 1 into affected eye Twice a day, Taking Multi For Her Tablet , Taking Tylenol 500 MG/15ML Liquid 1 tablet as needed every 6 hrs, Taking Ambien 10 MG Tablet 1 tablet at bedtime Once a day prn insomnia, Taking Metoprolol Succinate 25 MG Tablet Extended Release 24 Hour 1 tablet Once a day, Taking Xarelto 20 mg . tablet 1 tablet once a day, Notes: Dr. Wainer, Discontinued Benzonatate 200 MG Capsule 1 capsule as needed Q 8hrs prn cough, Discontinued Levaquin 750 MG Tablet 1 tablet Once a day, Medication List reviewed and reconciled with the patient       Allergies: Codeine (for allergy): vomiting for 3 days, Sulfa Drugs: rash.           Vitals: Wt 194.8, Wt change -1.6 lb, Ht 65.5, BMI 31.92, Pulse sitting 90, BP sitting 134/90.       Examination:  General Examination:  GENERAL APPEARANCE alert, oriented, NAD, pleasant.  SKIN: normal, no rash.  HEENT: normal.  HEAD: [place]/AT.  EYES: EOMI, Conjunctiva clear.  NECK: supple, FROM, without evidence of thyromegaly, adenopathy, or bruits, no jugular venous distention (JVD).  LUNGS: clear to auscultation bilaterally, no wheezes, rhonchi, rales, regular breathing rate and effort.    HEART: Irregularly irregular, no S3, S4, murmur or rub, point of maximul impulse (PMI) normal.  ABDOMEN: soft, non-tender/non-distended, bowel sounds present, no masses palpated, no bruit.  EXTREMITIES: no clubbing, no edema, pulses 2 plus bilaterally.  NEUROLOGIC EXAM: non-focal exam, alert and oriented x 3.  PERIPHERAL PULSES: normal (2+) bilaterally.  LYMPH NODES: no cervical adenopathy.  PSYCH affect normal.  Prior medical records, lab work on EKG reviewed.           Assessment:  1. Atrial fibrillation - 427.31 (Primary)  2. DVT - 453.40  3. Obesity - 278.00  4. Anticoagulant monitoring - V58.61        1. Atrial  fibrillation  LAB: PT (Prothrombin Time) (005199)     Stegall,Amy 10/16/2012 09:21:53 AM > ? McVey,Linda 10/16/2012 11:41:47 AM > scheduled for 10/16/2012   LAB: Basic Metabolic     Stegall,Amy 10/16/2012 09:22:08 AM > ? McVey,Linda 10/16/2012 11:41:20 AM > scheduled for 10/16/2012   LAB: CBC with Diff     Stegall,Amy 10/16/2012 09:21:24 AM > Linda, does she need labs? if so, call to Wheeler/s. McVey,Linda 10/16/2012 11:42:08 AM > scheduled for 10/16/2012    Imaging: EC Echocardiogram (Ordered for 10/13/2012) Notes: New-onset atrial fibrillation/newly discovered. She has been on anticoagulation, Xarelto, for greater than one month ( DVT treatment). I will perform cardioversion to help reestablish normal sinus rhythm. Risks and benefits of procedure have been discussed including pacemaker, worsening arrhythmia. Anesthesia will assist with procedure. There is a chance that atrial fibrillation will return after procedure. She seems to be tolerating the atrial fibrillation well, good rate control. I also explained that not only the surgery but recent bout of pneumonia can contribute to atrial fibrillation. Since she is getting better, no fevers etc, I feel comfortable proceeding with cardioversion at this time. She is very eager to go on her trip to [place]. I think that this would be reasonable. We will check echocardiogram as part of evaluation for newly discovered atrial fibrillation.       2. DVT  Notes: Treated with Xarelto.       3. Obesity  Notes: Continue to encourage weight loss.       4. Anticoagulant monitoring  Notes: It is important for her to continue with her anticoagulation after cardioversion for 4 weeks. Her CHADS-VAS score is one over female. Based upon this, she would not require long-term anticoagulation.        Preventive:   CC: Dr. [name] Miller.       Follow Up: I will followup after cardioversion.          

## 2012-10-18 NOTE — Interval H&P Note (Signed)
History and Physical Interval Note:  10/18/2012 11:48 AM  Yvonne Wheeler  has presented today for surgery, with the diagnosis of AFIB  The various methods of treatment have been discussed with the patient and family. After consideration of risks, benefits and other options for treatment, the patient has consented to  Procedure(s): CARDIOVERSION (N/A) as a surgical intervention .  The patient's history has been reviewed, patient examined, no change in status, stable for surgery.  I have reviewed the patient's chart and labs.  Questions were answered to the patient's satisfaction.     SKAINS, MARK

## 2012-10-18 NOTE — Anesthesia Postprocedure Evaluation (Signed)
  Anesthesia Post-op Note  Patient: Yvonne Wheeler  Procedure(s) Performed: Procedure(s): CARDIOVERSION (N/A)  Patient Location: Endoscopy Unit  Anesthesia Type:General  Level of Consciousness: awake, alert  and oriented  Airway and Oxygen Therapy: Patient Spontanous Breathing and Patient connected to nasal cannula oxygen  Post-op Pain: mild  Post-op Assessment: Post-op Vital signs reviewed, Patient's Cardiovascular Status Stable and Respiratory Function Stable  Post-op Vital Signs: stable  Complications: No apparent anesthesia complications

## 2012-10-19 ENCOUNTER — Encounter (HOSPITAL_COMMUNITY): Payer: Self-pay | Admitting: Cardiology

## 2012-11-14 ENCOUNTER — Other Ambulatory Visit (HOSPITAL_COMMUNITY): Payer: Self-pay | Admitting: Cardiology

## 2012-11-14 ENCOUNTER — Ambulatory Visit (HOSPITAL_COMMUNITY): Payer: BC Managed Care – PPO | Attending: Cardiology | Admitting: Cardiology

## 2012-11-14 DIAGNOSIS — I369 Nonrheumatic tricuspid valve disorder, unspecified: Secondary | ICD-10-CM

## 2012-11-14 DIAGNOSIS — I4891 Unspecified atrial fibrillation: Secondary | ICD-10-CM

## 2012-11-14 NOTE — Progress Notes (Signed)
Echo performed. 

## 2012-11-15 ENCOUNTER — Telehealth: Payer: Self-pay | Admitting: Cardiology

## 2012-11-15 NOTE — Telephone Encounter (Signed)
New Problem  Pt's husband request to speak with the Dr. Before scheduled appt on 10/22//He is concerned about the way the questions were answered. He wants to be sure that the Dr. Was comfortable with him asking questions. Please call back

## 2012-11-21 NOTE — Telephone Encounter (Signed)
Returned call for the third time ,left message to call back.

## 2012-11-22 ENCOUNTER — Ambulatory Visit: Payer: BC Managed Care – PPO | Admitting: Cardiology

## 2012-12-04 ENCOUNTER — Encounter: Payer: Self-pay | Admitting: Cardiology

## 2012-12-04 ENCOUNTER — Encounter: Payer: Self-pay | Admitting: *Deleted

## 2012-12-04 DIAGNOSIS — I499 Cardiac arrhythmia, unspecified: Secondary | ICD-10-CM | POA: Insufficient documentation

## 2012-12-05 ENCOUNTER — Ambulatory Visit (INDEPENDENT_AMBULATORY_CARE_PROVIDER_SITE_OTHER): Payer: BC Managed Care – PPO | Admitting: Interventional Cardiology

## 2012-12-05 ENCOUNTER — Encounter: Payer: Self-pay | Admitting: Interventional Cardiology

## 2012-12-05 VITALS — BP 112/72 | HR 94 | Ht 66.0 in | Wt 190.0 lb

## 2012-12-05 DIAGNOSIS — Z7901 Long term (current) use of anticoagulants: Secondary | ICD-10-CM | POA: Insufficient documentation

## 2012-12-05 DIAGNOSIS — I4891 Unspecified atrial fibrillation: Secondary | ICD-10-CM

## 2012-12-05 LAB — BASIC METABOLIC PANEL
BUN: 20 mg/dL (ref 6–23)
CO2: 32 mEq/L (ref 19–32)
Calcium: 9.4 mg/dL (ref 8.4–10.5)
Chloride: 100 mEq/L (ref 96–112)
Creatinine, Ser: 0.8 mg/dL (ref 0.4–1.2)
GFR: 73.2 mL/min (ref >60.00)
Glucose, Bld: 93 mg/dL (ref 70–99)
Potassium: 3.9 mEq/L (ref 3.5–5.1)
Sodium: 137 mEq/L (ref 135–145)

## 2012-12-05 MED ORDER — FLECAINIDE ACETATE 50 MG PO TABS: 50.0000 mg | ORAL_TABLET | Freq: Two times a day (BID) | ORAL | Status: DC

## 2012-12-05 MED ORDER — METOPROLOL SUCCINATE ER 25 MG PO TB24: 75.0000 mg | ORAL_TABLET | Freq: Every day | ORAL | Status: DC

## 2012-12-05 MED ORDER — RIVAROXABAN 20 MG PO TABS: 20.0000 mg | ORAL_TABLET | Freq: Every day | ORAL | Status: DC

## 2012-12-05 NOTE — Progress Notes (Signed)
Patient ID: Yvonne Wheeler, female   DOB: November 21, 1950, 62 y.o.   MRN: [id number]   Date: 12/05/2012 ID: Yvonne Wheeler, DOB 1951/01/02, MRN [id number] PCP: Yvonne Barthel, MD  Reason: Atrial fibrillation  ASSESSMENT;  1. Persistent atrial fibrillation, with recurrent arrhythmia after cardioversion September 2014. Exertional dyspnea and fatigue are clinical symptoms. Rare palpitations. Ventricular rate control is poor 2. Chronic anticoagulation,, Xarelto 3. History of DVT 4. Sleep disorder, not sleep apnea but never excluded.  PLAN:  1. Begin flecainide 50 mg twice a day 2. Continues Xarelto 3. Electrical cardioversion in 48 hours as an outpatient. Discussed the procedure with the patient and husband who recall details from the prior procedure. Also discussed referral for EP evaluation prior to cardioversion. We decided that we would give an additional attempt at rhythm control on medication before considering ablation. She would be a good ablation candidate given her normal left atrial size. 4. Optimize flecainide therapy after cardioversion 5. Increase metoprolol to 75 mg daily, to improve ventricular rate control   SUBJECTIVE: Yvonne Wheeler is a 62 y.o. female who is referred for management of atrial fibrillation. She saw my colleague Dr. Anne Fu, who performed electrical cardioversion in September. The cardioversion was successful but she maintained sinus rhythm for less than 3 days. She has maintained therapy with Xarelto, and is here for further discussion and understanding of her problem. She is accompanied by her husband. Compared to 6 months ago her exertional tolerance has significantly decreased. There is more dyspnea with exertion than previously noted. At rest there no complaints. Palpitations are minor complaints. She denies syncope, orthopnea, PND, ankle swelling, and chest pain.  An echocardiogram done recently demonstrated normal LV size with lower normal LV function. There is no  history of coronary disease or prior myocardial infarction.  Current outpatient prescriptions:acetaminophen (TYLENOL) 500 MG tablet, Take 500 mg by mouth every 6 (six) hours as needed., Disp: , Rfl: ;  cycloSPORINE (RESTASIS) 0.05 % ophthalmic emulsion, Place 1 drop into both eyes every 12 (twelve) hours., Disp: , Rfl: ;  metoprolol succinate (TOPROL-XL) 25 MG 24 hr tablet, Take 3 tablets (75 mg total) by mouth daily. Take 2 tablets in the am and 1 Tablet in the pm, Disp: 90 tablet, Rfl: 10 Multiple Vitamin (MULITIVITAMIN WITH MINERALS) TABS, Take 1 tablet by mouth daily., Disp: , Rfl: ;  Rivaroxaban (XARELTO) 20 MG TABS tablet, Take 1 tablet (20 mg total) by mouth daily., Disp: 30 tablet, Rfl: 10;  flecainide (TAMBOCOR) 50 MG tablet, Take 1 tablet (50 mg total) by mouth 2 (two) times daily., Disp: 60 tablet, Rfl: 6  Allergies  Allergen Reactions  . Codeine     REACTION: sick to stomach  . Sulfonamide Derivatives     REACTION: rash   .meds  Past Medical History  Diagnosis Date  . Dry eyes   . Complication of anesthesia 2004    post op nausea/vomiting and difficult to arouse  . Dysrhythmia   . Pneumonia 10/02/2012  . Atrial fibrillation 10/18/2012    Cardioversion - successful. 10/17/12   . 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 Tonsillectomy    . Meniscus repair  08/2012  . Lithotripsy    . Cardioversion N/A 10/18/2012    Procedure: CARDIOVERSION;  Surgeon: Donato Schultz, MD;  Location: [facility] ENDOSCOPY;  Service: Cardiovascular;  Laterality: N/A;    History   Social History  . Marital Status: Married  Spouse Name: N/A    Number of Children: N/A  . Years of Education: N/A   Occupational History  . Not on file.   Social History Main Topics  . Smoking status: Never Smoker   . Smokeless tobacco: Never Used  . Alcohol Use: Yes     Comment: occassional  . Drug Use: No  . Sexual Activity: Not on file   Other  Topics Concern  . Not on file   Social History Narrative  . No narrative on file    History reviewed. No pertinent family history.  ROS: Denies syncope, bleeding, pulmonary embolism, rheumatic heart disease, lung disease, GI complaints, bleeding, and neurological abnormality.. Other systems negative for complaints.  OBJECTIVE: BP 112/72  Pulse 94  Ht 5\' 6"  (1.676 m)  Wt 190 lb (86.183 kg)  BMI 30.68 kg/m2,  General: No acute distress, moderately obese, appearing her stated age HEENT: normal with no evidence of jaundice or excessive pallor. Neck: JVD flat. Carotids irregularly irregular to palpation. No bruit. Chest: Clear. Cardiac: Murmur: None. Gallop: None. Rhythm: Irregularly irregular with faster than controlled ventricular response. Other: Normal Abdomen: Bruit: None. Pulsation: Normal Extremities: Edema: None. Pulses: 2+ and symmetric Neuro: Normal Psych: Normal  ECG: Atrial fibrillation with rapid ventricular response at 94 beats per minute

## 2012-12-05 NOTE — Patient Instructions (Signed)
Start Flecainide 50mg  twice daily and Rx has been sent to your pharmacy  Continue taking Xarelto 20mg  daily  Lab Today: BMET  Your physician has recommended that you have a Cardioversion (DCCV). Electrical Cardioversion uses a jolt of electricity to your heart either through paddles or wired patches attached to your chest. This is a controlled, usually prescheduled, procedure. Defibrillation is done under light anesthesia in the hospital, and you usually go home the day of the procedure. This is done to get your heart back into a normal rhythm. You are not awake for the procedure. Please see the instruction sheet given to you today.

## 2012-12-06 ENCOUNTER — Encounter (HOSPITAL_COMMUNITY): Payer: Self-pay | Admitting: Pharmacist

## 2012-12-06 ENCOUNTER — Other Ambulatory Visit: Payer: Self-pay | Admitting: Interventional Cardiology

## 2012-12-06 DIAGNOSIS — I4891 Unspecified atrial fibrillation: Secondary | ICD-10-CM

## 2012-12-07 ENCOUNTER — Telehealth: Payer: Self-pay | Admitting: Cardiology

## 2012-12-07 ENCOUNTER — Ambulatory Visit (HOSPITAL_COMMUNITY)
Admission: RE | Admit: 2012-12-07 | Discharge: 2012-12-07 | Disposition: A | Discharge status: Home / Self Care | Payer: BC Managed Care – PPO | Source: Ambulatory Visit | Attending: Interventional Cardiology | Admitting: Interventional Cardiology

## 2012-12-07 ENCOUNTER — Other Ambulatory Visit: Payer: Self-pay

## 2012-12-07 ENCOUNTER — Ambulatory Visit (HOSPITAL_COMMUNITY): Payer: BC Managed Care – PPO | Admitting: Anesthesiology

## 2012-12-07 ENCOUNTER — Encounter (HOSPITAL_COMMUNITY): Payer: Self-pay | Admitting: Anesthesiology

## 2012-12-07 ENCOUNTER — Encounter (HOSPITAL_COMMUNITY): Payer: BC Managed Care – PPO | Admitting: Anesthesiology

## 2012-12-07 ENCOUNTER — Encounter (HOSPITAL_COMMUNITY)
Admission: RE | Disposition: A | Discharge status: Home / Self Care | Payer: Self-pay | Source: Ambulatory Visit | Attending: Interventional Cardiology

## 2012-12-07 DIAGNOSIS — I4891 Unspecified atrial fibrillation: Secondary | ICD-10-CM

## 2012-12-07 DIAGNOSIS — Z7901 Long term (current) use of anticoagulants: Secondary | ICD-10-CM | POA: Insufficient documentation

## 2012-12-07 DIAGNOSIS — G4720 Circadian rhythm sleep disorder, unspecified type: Secondary | ICD-10-CM | POA: Insufficient documentation

## 2012-12-07 DIAGNOSIS — Z86718 Personal history of other venous thrombosis and embolism: Secondary | ICD-10-CM | POA: Insufficient documentation

## 2012-12-07 HISTORY — PX: CARDIOVERSION: SHX1299

## 2012-12-07 SURGERY — CARDIOVERSION
Anesthesia: Monitor Anesthesia Care | Wound class: Clean

## 2012-12-07 MED ORDER — LIDOCAINE HCL (CARDIAC) 20 MG/ML IV SOLN
INTRAVENOUS | Status: DC | PRN
  Administered 2012-12-07: 30 mg via INTRAVENOUS

## 2012-12-07 MED ORDER — METOPROLOL SUCCINATE ER 25 MG PO TB24: 25.0000 mg | ORAL_TABLET | Freq: Two times a day (BID) | ORAL | Status: DC

## 2012-12-07 MED ORDER — FLECAINIDE ACETATE 50 MG PO TABS: 75.0000 mg | ORAL_TABLET | Freq: Two times a day (BID) | ORAL | Status: DC

## 2012-12-07 MED ORDER — SODIUM CHLORIDE 0.9 % IV SOLN
INTRAVENOUS | Status: DC | PRN
  Administered 2012-12-07: 13:00:00 via INTRAVENOUS

## 2012-12-07 MED ORDER — PROPOFOL 10 MG/ML IV BOLUS
INTRAVENOUS | Status: DC | PRN
  Administered 2012-12-07: 70 mg via INTRAVENOUS

## 2012-12-07 NOTE — Anesthesia Postprocedure Evaluation (Signed)
  Anesthesia Post-op Note  Patient: Yvonne Wheeler  Procedure(s) Performed: Procedure(s): CARDIOVERSION (N/A)  Patient Location: Short Stay  Anesthesia Type:MAC  Level of Consciousness: awake  Airway and Oxygen Therapy: Patient Spontanous Breathing  Post-op Pain: mild  Post-op Assessment: Post-op Vital signs reviewed  Post-op Vital Signs: Reviewed  Complications: No apparent anesthesia complications

## 2012-12-07 NOTE — Preoperative (Signed)
Beta Blockers   Reason not to administer Beta Blockers:Toprol XL taken this am 12/07/12

## 2012-12-07 NOTE — H&P (View-Only) (Signed)
Patient ID: Yvonne Wheeler, female   DOB: 12/24/1950, 62 y.o.   MRN: [id number]   Date: 12/05/2012 ID: Yvonne Wheeler, DOB 01/29/1951, MRN [id number] PCP: BREEN,JAMES O, MD  Reason: Atrial fibrillation  ASSESSMENT;  1. Persistent atrial fibrillation, with recurrent arrhythmia after cardioversion September 2014. Exertional dyspnea and fatigue are clinical symptoms. Rare palpitations. Ventricular rate control is poor 2. Chronic anticoagulation,, Xarelto 3. History of DVT 4. Sleep disorder, not sleep apnea but never excluded.  PLAN:  1. Begin flecainide 50 mg twice a day 2. Continues Xarelto 3. Electrical cardioversion in 48 hours as an outpatient. Discussed the procedure with the patient and husband who recall details from the prior procedure. Also discussed referral for EP evaluation prior to cardioversion. We decided that we would give an additional attempt at rhythm control on medication before considering ablation. She would be a good ablation candidate given her normal left atrial size. 4. Optimize flecainide therapy after cardioversion 5. Increase metoprolol to 75 mg daily, to improve ventricular rate control   SUBJECTIVE: Yvonne Wheeler is a 62 y.o. female who is referred for management of atrial fibrillation. She saw my colleague Dr. Skains, who performed electrical cardioversion in September. The cardioversion was successful but she maintained sinus rhythm for less than 3 days. She has maintained therapy with Xarelto, and is here for further discussion and understanding of her problem. She is accompanied by her husband. Compared to 6 months ago her exertional tolerance has significantly decreased. There is more dyspnea with exertion than previously noted. At rest there no complaints. Palpitations are minor complaints. She denies syncope, orthopnea, PND, ankle swelling, and chest pain.  An echocardiogram done recently demonstrated normal LV size with lower normal LV function. There is no  history of coronary disease or prior myocardial infarction.  Current outpatient prescriptions:acetaminophen (TYLENOL) 500 MG tablet, Take 500 mg by mouth every 6 (six) hours as needed., Disp: , Rfl: ;  cycloSPORINE (RESTASIS) 0.05 % ophthalmic emulsion, Place 1 drop into both eyes every 12 (twelve) hours., Disp: , Rfl: ;  metoprolol succinate (TOPROL-XL) 25 MG 24 hr tablet, Take 3 tablets (75 mg total) by mouth daily. Take 2 tablets in the am and 1 Tablet in the pm, Disp: 90 tablet, Rfl: 10 Multiple Vitamin (MULITIVITAMIN WITH MINERALS) TABS, Take 1 tablet by mouth daily., Disp: , Rfl: ;  Rivaroxaban (XARELTO) 20 MG TABS tablet, Take 1 tablet (20 mg total) by mouth daily., Disp: 30 tablet, Rfl: 10;  flecainide (TAMBOCOR) 50 MG tablet, Take 1 tablet (50 mg total) by mouth 2 (two) times daily., Disp: 60 tablet, Rfl: 6  Allergies  Allergen Reactions  . Codeine     REACTION: sick to stomach  . Sulfonamide Derivatives     REACTION: rash   .meds  Past Medical History  Diagnosis Date  . Dry eyes   . Complication of anesthesia 2004    post op nausea/vomiting and difficult to arouse  . Dysrhythmia   . Pneumonia 10/02/2012  . Atrial fibrillation 10/18/2012    Cardioversion - successful. 10/17/12   . 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 Tonsillectomy    . Meniscus repair  08/2012  . Lithotripsy    . Cardioversion N/A 10/18/2012    Procedure: CARDIOVERSION;  Surgeon: Mark Skains, MD;  Location: [facility] ENDOSCOPY;  Service: Cardiovascular;  Laterality: N/A;    History   Social History  . Marital Status: Married      Spouse Name: N/A    Number of Children: N/A  . Years of Education: N/A   Occupational History  . Not on file.   Social History Main Topics  . Smoking status: Never Smoker   . Smokeless tobacco: Never Used  . Alcohol Use: Yes     Comment: occassional  . Drug Use: No  . Sexual Activity: Not on file   Other  Topics Concern  . Not on file   Social History Narrative  . No narrative on file    History reviewed. No pertinent family history.  ROS: Denies syncope, bleeding, pulmonary embolism, rheumatic heart disease, lung disease, GI complaints, bleeding, and neurological abnormality.. Other systems negative for complaints.  OBJECTIVE: BP 112/72  Pulse 94  Ht 5' 6" (1.676 m)  Wt 190 lb (86.183 kg)  BMI 30.68 kg/m2,  General: No acute distress, moderately obese, appearing her stated age HEENT: normal with no evidence of jaundice or excessive pallor. Neck: JVD flat. Carotids irregularly irregular to palpation. No bruit. Chest: Clear. Cardiac: Murmur: None. Gallop: None. Rhythm: Irregularly irregular with faster than controlled ventricular response. Other: Normal Abdomen: Bruit: None. Pulsation: Normal Extremities: Edema: None. Pulses: 2+ and symmetric Neuro: Normal Psych: Normal  ECG: Atrial fibrillation with rapid ventricular response at 94 beats per minute  

## 2012-12-07 NOTE — Transfer of Care (Signed)
Immediate Anesthesia Transfer of Care Note  Patient: Yvonne Wheeler  Procedure(s) Performed: Procedure(s): CARDIOVERSION (N/A)  Patient Location: PACU and Endoscopy Unit  Anesthesia Type:MAC  Level of Consciousness: awake, alert  and patient cooperative  Airway & Oxygen Therapy: Patient Spontanous Breathing and Patient connected to nasal cannula oxygen  Post-op Assessment: Report given to PACU RN, Post -op Vital signs reviewed and stable and Patient moving all extremities X 4  Post vital signs: Reviewed and stable  Complications: No apparent anesthesia complications

## 2012-12-07 NOTE — Telephone Encounter (Signed)
New message    Returned [employer] call.  She want you to know that she switched from Dr Anne Fu to Dr Katrinka Blazing and that you do not need to return her call

## 2012-12-07 NOTE — Interval H&P Note (Signed)
History and Physical Interval Note:  12/07/2012 1:13 PM  Yvonne Wheeler  has presented today for surgery, with the diagnosis of afib  The various methods of treatment have been discussed with the patient and family. After consideration of risks, benefits and other options for treatment, the patient has consented to  Procedure(s): CARDIOVERSION (N/A) as a surgical intervention .  The patient's history has been reviewed, patient examined, no change in status, stable for surgery.  I have reviewed the patient's chart and labs.  Questions were answered to the patient's satisfaction.     Lesleigh Noe

## 2012-12-07 NOTE — Telephone Encounter (Signed)
Left a third message ,closing encounter.

## 2012-12-07 NOTE — CV Procedure (Signed)
Electrical Cardioversion Procedure Note Yvonne Wheeler [id number] Apr 04, 1950  Procedure: Electrical Cardioversion Indications:  Atrial Fibrillation  Time Out: Verified patient identification, verified procedure,medications/allergies/relevent history reviewed, required imaging and test results available.  Performed  Procedure Details  The patient was NPO after midnight. Anesthesia was administered at the beside  by Dr.Edwards with 70mg  of propofol.  Cardioversion was done with synchronized biphasic defibrillation with AP pads with 200watts.  The patient converted to normal sinus rhythm. The patient tolerated the procedure well   IMPRESSION:  Successful cardioversion of atrial fibrillation    Veatrice Kells W 12/07/2012, 1:20 PM

## 2012-12-07 NOTE — Anesthesia Preprocedure Evaluation (Addendum)
Anesthesia Evaluation  Patient identified by MRN, date of birth, ID band Patient awake    Reviewed: Allergy & Precautions, H&P , NPO status , Patient's Chart, lab work & pertinent test results, reviewed documented beta blocker date and time , Unable to perform ROS - Chart review only  Airway Mallampati: II      Dental   Pulmonary pneumonia -,          Cardiovascular + dysrhythmias Atrial Fibrillation     Neuro/Psych    GI/Hepatic negative GI ROS, Neg liver ROS,   Endo/Other  negative endocrine ROS  Renal/GU negative Renal ROS     Musculoskeletal   Abdominal   Peds  Hematology   Anesthesia Other Findings   Reproductive/Obstetrics                          Anesthesia Physical Anesthesia Plan  ASA: III  Anesthesia Plan: MAC   Post-op Pain Management:    Induction: Intravenous  Airway Management Planned: Simple Face Mask  Additional Equipment:   Intra-op Plan:   Post-operative Plan:   Informed Consent:   Dental advisory given  Plan Discussed with: CRNA, Anesthesiologist and Surgeon  Anesthesia Plan Comments:         Anesthesia Quick Evaluation

## 2012-12-08 ENCOUNTER — Telehealth: Payer: Self-pay

## 2012-12-08 ENCOUNTER — Other Ambulatory Visit: Payer: Self-pay

## 2012-12-08 DIAGNOSIS — I4891 Unspecified atrial fibrillation: Secondary | ICD-10-CM

## 2012-12-08 NOTE — Telephone Encounter (Signed)
Message copied by Jarvis Newcomer on Fri Dec 08, 2012 12:24 PM ------      Message from: Verdis Prime      Created: Thu Dec 07, 2012  1:35 PM      Regarding: Post cardioversion office visit.       Please have Jami Bogdanski come in for OV and ECG in 1 week . ------

## 2012-12-11 ENCOUNTER — Encounter (HOSPITAL_COMMUNITY): Payer: Self-pay | Admitting: Interventional Cardiology

## 2012-12-11 NOTE — Telephone Encounter (Signed)
Noted  

## 2012-12-13 ENCOUNTER — Ambulatory Visit: Payer: BC Managed Care – PPO | Admitting: Interventional Cardiology

## 2012-12-13 NOTE — Telephone Encounter (Signed)
pt appt sch for 12/14/12 @11 :45.pt aware of appt

## 2012-12-14 ENCOUNTER — Encounter: Payer: Self-pay | Admitting: Interventional Cardiology

## 2012-12-14 ENCOUNTER — Ambulatory Visit (INDEPENDENT_AMBULATORY_CARE_PROVIDER_SITE_OTHER): Payer: BC Managed Care – PPO | Admitting: Interventional Cardiology

## 2012-12-14 VITALS — BP 106/66 | HR 63 | Ht 66.0 in | Wt 198.0 lb

## 2012-12-14 DIAGNOSIS — Z7901 Long term (current) use of anticoagulants: Secondary | ICD-10-CM

## 2012-12-14 DIAGNOSIS — I4891 Unspecified atrial fibrillation: Secondary | ICD-10-CM

## 2012-12-14 MED ORDER — FLECAINIDE ACETATE 100 MG PO TABS: 100.0000 mg | ORAL_TABLET | Freq: Two times a day (BID) | ORAL | Status: DC

## 2012-12-14 MED ORDER — METOPROLOL SUCCINATE ER 25 MG PO TB24: 12.5000 mg | ORAL_TABLET | Freq: Two times a day (BID) | ORAL | Status: DC

## 2012-12-14 NOTE — Progress Notes (Signed)
Patient ID: Yvonne Wheeler, female   DOB: 1951/01/26, 62 y.o.   MRN: [id number]    [address] [place], [place]  [address] Phone: [phone] Fax:  [phone]  Date:  12/14/2012   ID:  Yvonne Wheeler, DOB 31-Aug-1950, MRN [id number]  PCP:  Neldon Labella, MD   ASSESSMENT:  1. Persistent atrial fibrillation, now maintaining normal sinus rhythm after cardioversion on flecanide 2. Anticoagulation therapy with Xarelto.  PLAN:  1. Exercise treadmill test on flecainide. 2. Wean and discontinue beta blocker therapy prior to the stress test. 3. Increase flecainide to 100 mg twice daily.   SUBJECTIVE: Yvonne Wheeler is a 62 y.o. female who now has much more energy after cardioversion. She has been exercising without difficulty.   Wt Readings from Last 3 Encounters:  12/14/12 198 lb (89.812 kg)  12/05/12 190 lb (86.183 kg)  10/18/12 190 lb (86.183 kg)     Past Medical History  Diagnosis Date  . Dry eyes   . Complication of anesthesia 2004    post op nausea/vomiting and difficult to arouse  . Dysrhythmia   . Pneumonia 10/02/2012  . Atrial fibrillation 10/18/2012    Cardioversion - successful. 10/17/12   . 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 acetaminophen (TYLENOL) 500 MG tablet Take 1,000 mg by mouth every 6 (six) hours as needed for headache.       . cycloSPORINE (RESTASIS) 0.05 % ophthalmic emulsion Place 1 drop into both eyes every 12 (twelve) hours.      . metoprolol succinate (TOPROL-XL) 25 MG 24 hr tablet Take 1 tablet (25 mg total) by mouth 2 (two) times daily. Takes 50 mg in the am and 25 mg in the pm      . Multiple Vitamin (MULITIVITAMIN WITH MINERALS) TABS Take 1 tablet by mouth daily.      . Rivaroxaban (XARELTO) 20 MG TABS tablet Take 1 tablet (20 mg total) by mouth daily.  30 tablet  10   No current facility-administered medications for this visit.     Allergies:    Allergies  Allergen Reactions  . Codeine Nausea And Vomiting  . Sulfonamide Derivatives Hives    Social History:  The patient  reports that she has never smoked. She has never used smokeless tobacco. She reports that she drinks alcohol. She reports that she does not use illicit drugs.   ROS:  Please see the history of present illness.    All other systems reviewed and negative.   OBJECTIVE: VS:  BP 106/66  Pulse 63  Ht 5\' 6"  (1.676 m)  Wt 198 lb (89.812 kg)  BMI 31.97 kg/m2 Well nourished, well developed, in no acute distress HEENT: normal Neck: JVD flat. Carotid bruit absent  Cardiac:  normal S1, S2; RRR; no murmur Lungs:  clear to auscultation bilaterally, no wheezing, rhonchi or rales Abd: soft, nontender, no hepatomegaly Ext: Edema absent. Pulses 2+ Skin: warm and dry Neuro:  CNs 2-12 intact, no focal abnormalities noted  EKG:  Normal sinus rhythm with QTC 446 ms       Signed, Darci Needle III, MD 12/14/2012 12:22 PM

## 2012-12-14 NOTE — Patient Instructions (Addendum)
Your physician has recommended you make the following change in your medication:   1. Decrease your metoprolol to 1/2 tab twice daily for 1 week then Stop 2. Start Flecainide 100 mg twice daily   Your physician has requested that you have an exercise tolerance test. For further information please visit [email or web address] Please also follow instruction sheet, as given.  Your physician recommends that you schedule a follow-up depending on GXT results

## 2012-12-26 ENCOUNTER — Encounter: Payer: Self-pay | Admitting: Interventional Cardiology

## 2012-12-26 ENCOUNTER — Ambulatory Visit (INDEPENDENT_AMBULATORY_CARE_PROVIDER_SITE_OTHER): Payer: BC Managed Care – PPO | Admitting: Interventional Cardiology

## 2012-12-26 ENCOUNTER — Telehealth: Payer: Self-pay

## 2012-12-26 VITALS — BP 130/74 | HR 90 | Ht 66.0 in | Wt 191.8 lb

## 2012-12-26 DIAGNOSIS — I4891 Unspecified atrial fibrillation: Secondary | ICD-10-CM

## 2012-12-26 DIAGNOSIS — G4725 Circadian rhythm sleep disorder, jet lag type: Secondary | ICD-10-CM

## 2012-12-26 DIAGNOSIS — Z7901 Long term (current) use of anticoagulants: Secondary | ICD-10-CM

## 2012-12-26 MED ORDER — METOPROLOL SUCCINATE ER 25 MG PO TB24: 25.0000 mg | ORAL_TABLET | Freq: Every day | ORAL | Status: DC

## 2012-12-26 NOTE — Progress Notes (Addendum)
Patient ID: Yvonne Wheeler, female   DOB: Mar 03, 1950, 62 y.o.   MRN: [id number]    [address] [place], [place]  [address] Phone: [phone] Fax:  [phone]  Date:  12/26/2012   ID:  ASHLYN CABLER, DOB 1950/05/31, MRN [id number]  PCP:  Neldon Labella, MD   ASSESSMENT:  1. Paroxysmal atrial fibrillation, clinically present this morning at home since the patient awakened. History she came into the office she feels her heart left back into rhythm.  PLAN:  1. Resume Metoprolol succinate 25 mg a day   SUBJECTIVE: Yvonne Wheeler is a 62 y.o. female who awakened this morning with fatigue and palpitations. She felt her pulse and it was irregular. It lasted most of the morning and she finally called the office. We asked her to come in and she feels that upon arrival her heart suddenly went back into rhythm. She now feels better.   Wt Readings from Last 3 Encounters:  12/26/12 191 lb 12.8 oz (87 kg)  12/14/12 198 lb (89.812 kg)  12/05/12 190 lb (86.183 kg)     Past Medical History  Diagnosis Date  . Dry eyes   . Complication of anesthesia 2004    post op nausea/vomiting and difficult to arouse  . Dysrhythmia   . Pneumonia 10/02/2012  . Atrial fibrillation 10/18/2012    Cardioversion - successful. 10/17/12   . 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 acetaminophen (TYLENOL) 500 MG tablet Take 1,000 mg by mouth every 6 (six) hours as needed for headache.       . cycloSPORINE (RESTASIS) 0.05 % ophthalmic emulsion Place 1 drop into both eyes every 12 (twelve) hours.      . flecainide (TAMBOCOR) 100 MG tablet Take 1 tablet (100 mg total) by mouth 2 (two) times daily.  60 tablet  12  . Multiple Vitamin (MULITIVITAMIN WITH MINERALS) TABS Take 1 tablet by mouth daily.      . Rivaroxaban (XARELTO) 20 MG TABS tablet Take 1 tablet (20 mg total) by mouth daily.  30 tablet  10   No current  facility-administered medications for this visit.    Allergies:    Allergies  Allergen Reactions  . Codeine Nausea And Vomiting  . Sulfonamide Derivatives Hives    Social History:  The patient  reports that she has never smoked. She has never used smokeless tobacco. She reports that she drinks alcohol. She reports that she does not use illicit drugs.   ROS:  Please see the history of present illness.   All other systems reviewed and negative.   OBJECTIVE: VS:  BP 130/74  Pulse 90  Ht 5\' 6"  (1.676 m)  Wt 191 lb 12.8 oz (87 kg)  BMI 30.97 kg/m2 Well nourished, well developed, in no acute distress, frustrated HEENT: normal Neck: JVD flat. Carotid bruit 2+  Cardiac:  normal S1, S2; RRR; no murmur Lungs:  clear to auscultation bilaterally, no wheezing, rhonchi or rales Abd: soft, nontender, no hepatomegaly Ext: Edema absent. Pulses 2+ and symmetric and regular Skin: warm and dry Neuro:  CNs 2-12 intact, no focal abnormalities noted  EKG:  Normal sinus rhythm at 90 beats per minute       Signed, Darci Needle III, MD 12/26/2012 3:01 PM

## 2012-12-26 NOTE — Telephone Encounter (Signed)
Follow up ° ° ° ° ° °Returned [name]'s call ° °

## 2012-12-26 NOTE — Telephone Encounter (Signed)
pt sts that she believes she isback in afib and does not feel well. pt adv to come in today 12/26/12 @2 :15 pt verbalized understanding.

## 2012-12-26 NOTE — Patient Instructions (Signed)
Restart Metoprolol Succinate 25mg  daily  Keep your upcoming appointment for your exercise tolerance test

## 2012-12-26 NOTE — Telephone Encounter (Signed)
called pt home tel pt husband sts that she is at work and the best way to reach her is on her cell.lmomon pt cell for pt to return call.

## 2012-12-26 NOTE — Telephone Encounter (Signed)
New Problem  Pt called-- Feels as if her AFIB is back and requests a call back on how to move forward// please assist

## 2013-01-04 ENCOUNTER — Ambulatory Visit (HOSPITAL_COMMUNITY): Payer: BC Managed Care – PPO | Attending: Orthopedic Surgery

## 2013-01-04 ENCOUNTER — Encounter (HOSPITAL_COMMUNITY): Payer: BC Managed Care – PPO

## 2013-01-04 DIAGNOSIS — M79609 Pain in unspecified limb: Secondary | ICD-10-CM

## 2013-01-04 DIAGNOSIS — M7989 Other specified soft tissue disorders: Secondary | ICD-10-CM | POA: Insufficient documentation

## 2013-01-04 DIAGNOSIS — Z86718 Personal history of other venous thrombosis and embolism: Secondary | ICD-10-CM

## 2013-01-23 ENCOUNTER — Ambulatory Visit (INDEPENDENT_AMBULATORY_CARE_PROVIDER_SITE_OTHER): Payer: BC Managed Care – PPO | Admitting: Interventional Cardiology

## 2013-01-23 DIAGNOSIS — I4891 Unspecified atrial fibrillation: Secondary | ICD-10-CM

## 2013-01-23 NOTE — Progress Notes (Signed)
Exercise Treadmill Test  Pre-Exercise Testing Evaluation Rhythm: sinus bradycardia  Rate: 54 bpm     Test  Exercise Tolerance Test Ordering MD: Verdis Prime, MD  Interpreting MD: Verdis Prime, MD  Unique Test No: 1  Treadmill:  1  Indication for ETT: afib/flecainide  Contraindication to ETT: No   Stress Modality: exercise - treadmill  Cardiac Imaging Performed: non   Protocol: standard Bruce - maximal  Max BP:  185/87  Max MPHR (bpm):  159 85% MPR (bpm):  135  MPHR obtained (bpm):  136 % MPHR obtained:  85%  Reached 85% MPHR (min:sec):  7:15 minutes Total Exercise Time (min-sec):  7:20 min  Workload in METS:  9.0 Borg Scale: 17  Reason ETT Terminated:  Heart rate achieved.    ST Segment Analysis At Rest: normal ST segments - no evidence of significant ST depression With Exercise: no evidence of significant ST depression  Other Information Arrhythmia:  No Angina during ETT:  absent (0) Quality of ETT:  diagnostic  ETT Interpretation:  normal - no evidence of ischemia by ST analysis or arrhythmia  Comments: No exercise induced arrhythmia on Flecainide  Recommendations: 3 month

## 2013-03-13 ENCOUNTER — Other Ambulatory Visit: Payer: Self-pay

## 2013-03-13 DIAGNOSIS — I4891 Unspecified atrial fibrillation: Secondary | ICD-10-CM

## 2013-03-13 MED ORDER — METOPROLOL SUCCINATE ER 25 MG PO TB24: 25.0000 mg | ORAL_TABLET | Freq: Every day | ORAL | Status: DC

## 2013-04-24 ENCOUNTER — Ambulatory Visit (INDEPENDENT_AMBULATORY_CARE_PROVIDER_SITE_OTHER): Payer: BC Managed Care – PPO | Admitting: Interventional Cardiology

## 2013-04-24 ENCOUNTER — Encounter: Payer: Self-pay | Admitting: Interventional Cardiology

## 2013-04-24 VITALS — BP 127/71 | HR 71

## 2013-04-24 DIAGNOSIS — Z79899 Other long term (current) drug therapy: Secondary | ICD-10-CM | POA: Insufficient documentation

## 2013-04-24 DIAGNOSIS — I4891 Unspecified atrial fibrillation: Secondary | ICD-10-CM

## 2013-04-24 DIAGNOSIS — Z5181 Encounter for therapeutic drug level monitoring: Secondary | ICD-10-CM | POA: Insufficient documentation

## 2013-04-24 DIAGNOSIS — Z7901 Long term (current) use of anticoagulants: Secondary | ICD-10-CM

## 2013-04-24 NOTE — Progress Notes (Signed)
Patient ID: Yvonne Wheeler, female   DOB: Mar 17, 1950, 63 y.o.   MRN: [id number]    [address], [address] [place], [place]  [address] Phone: [phone] Fax:  [phone]  Date:  04/24/2013   ID:  Yvonne Wheeler, DOB 1950/03/12, MRN [id number]  PCP:  Neldon Labella, MD   ASSESSMENT:  1. Paroxysmal atrial fibrillation, with rare recurrences none lasting greater than 15 minutes 2. Chronic anticoagulation on Xarelto, with no bleeding complications 3. Flecainide therapy without complications  PLAN:  1. Continue current medical regimen 2. Caution to let me know if breakthrough atrial fibrillation becomes more troublesome and impairs quality of life 3. Call if any bleeding complications 4. If prolonged episodes of atrial fibrillation, she should take an additional dose of metoprolol 5. Clinical followup in 6 months   SUBJECTIVE: Yvonne Wheeler is a 63 y.o. female who is doing well. She denies syncope. She has had an occasional episode of tachycardia palpitation lasting 5-15 minutes. She feels these episodes of atrial fibrillation. They have not been terribly disruptive. They have occurred while awake. She denies neurological complaints. Her exertional tolerance has been great.   Wt Readings from Last 3 Encounters:  12/26/12 191 lb 12.8 oz (87 kg)  12/14/12 198 lb (89.812 kg)  12/05/12 190 lb (86.183 kg)     Past Medical History  Diagnosis Date  . Dry eyes   . Complication of anesthesia 2004    post op nausea/vomiting and difficult to arouse  . Dysrhythmia   . Pneumonia 10/02/2012  . Atrial fibrillation 10/18/2012    Cardioversion - successful. 10/17/12   . 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 acetaminophen (TYLENOL) 500 MG tablet Take 1,000 mg by mouth every 6 (six) hours as needed for headache.       . benzonatate (TESSALON) 200 MG capsule Take 200 mg by mouth every 8 (eight) hours.       . cycloSPORINE (RESTASIS) 0.05 % ophthalmic emulsion Place 1 drop into both eyes every 12 (twelve) hours.      Marland Kitchen dextromethorphan (DELSYM) 30 MG/5ML liquid Take 10 mg by mouth as needed for cough.      . flecainide (TAMBOCOR) 100 MG tablet Take 1 tablet (100 mg total) by mouth 2 (two) times daily.  60 tablet  12  . metoprolol succinate (TOPROL XL) 25 MG 24 hr tablet Take 1 tablet (25 mg total) by mouth daily.  30 tablet  6  . Multiple Vitamin (MULITIVITAMIN WITH MINERALS) TABS Take 1 tablet by mouth daily.      . Rivaroxaban (XARELTO) 20 MG TABS tablet Take 1 tablet (20 mg total) by mouth daily.  30 tablet  10   No current facility-administered medications for this visit.    Allergies:    Allergies  Allergen Reactions  . Codeine Nausea And Vomiting  . Sulfonamide Derivatives Hives    Social History:  The patient  reports that she has never smoked. She has never used smokeless tobacco. She reports that she drinks alcohol. She reports that she does not use illicit drugs.   ROS:  Please see the history of present illness.   Denies blood in urine, stool, and other. No transient neurological complaints.   All other systems reviewed and negative.   OBJECTIVE: VS:  BP 127/71  Pulse 71 Well nourished, well developed, in no acute distress, obese HEENT: normal Neck: JVD flat.  Carotid bruit 2+ bilateral  Cardiac:  normal S1, S2; RRR; no murmur Lungs:  clear to auscultation bilaterally, no wheezing, rhonchi or rales Abd: soft, nontender, no hepatomegaly Ext: Edema absent. Pulses 2+ Skin: warm and dry Neuro:  CNs 2-12 intact, no focal abnormalities noted  EKG:  Not repeated       Signed, Darci NeedleHenry W. B. Pattiann Solanki III, MD 04/24/2013 10:29 AM

## 2013-04-24 NOTE — Patient Instructions (Signed)
Your physician recommends that you continue on your current medications as directed. Please refer to the Current Medication list given to you today.  Your physician wants you to follow-up in: 6 months. You will receive a reminder letter in the mail two months in advance. If you don't receive a letter, please call our office to schedule the follow-up appointment.  

## 2013-10-23 ENCOUNTER — Ambulatory Visit (INDEPENDENT_AMBULATORY_CARE_PROVIDER_SITE_OTHER): Payer: BC Managed Care – PPO | Admitting: Interventional Cardiology

## 2013-10-23 ENCOUNTER — Encounter: Payer: Self-pay | Admitting: Interventional Cardiology

## 2013-10-23 VITALS — BP 134/68 | HR 68 | Ht 66.0 in | Wt 193.8 lb

## 2013-10-23 DIAGNOSIS — Z5181 Encounter for therapeutic drug level monitoring: Secondary | ICD-10-CM

## 2013-10-23 DIAGNOSIS — Z79899 Other long term (current) drug therapy: Secondary | ICD-10-CM

## 2013-10-23 DIAGNOSIS — I4891 Unspecified atrial fibrillation: Secondary | ICD-10-CM

## 2013-10-23 DIAGNOSIS — Z7901 Long term (current) use of anticoagulants: Secondary | ICD-10-CM

## 2013-10-23 DIAGNOSIS — I480 Paroxysmal atrial fibrillation: Secondary | ICD-10-CM

## 2013-10-23 MED ORDER — RIVAROXABAN 20 MG PO TABS: 20.0000 mg | ORAL_TABLET | Freq: Every day | ORAL | Status: DC

## 2013-10-23 MED ORDER — DILTIAZEM HCL ER COATED BEADS 180 MG PO CP24: 180.0000 mg | ORAL_CAPSULE | Freq: Every day | ORAL | Status: DC

## 2013-10-23 NOTE — Patient Instructions (Signed)
Your physician has recommended you make the following change in your medication:  1) REDUCE Metoprolol to 1/2 tablet twice daily  for 1 week then STOP 2) START Diltiazem CD  daily.in 1 week after completing Metoprolol  Your physician wants you to follow-up in: 6 months with Dr.Smith You will receive a reminder letter in the mail two months in advance. If you don't receive a letter, please call our office to schedule the follow-up appointment.

## 2013-10-23 NOTE — Progress Notes (Addendum)
Patient ID: Yvonne Wheeler, female   DOB: 04-May-1950, 63 y.o.   MRN: [id number]    [address] [place], [place]  [address] Phone: [phone] Fax:  [phone]  Date:  10/23/2013   ID:  Yvonne Wheeler, DOB 1950-04-03, MRN [id number]  PCP:  Neldon Labella, MD   ASSESSMENT:  1. Paroxysmal atrial fibrillation 2. Heartburn but with normal exercise treadmill to 3. Hair loss, skin rash, and insomnia, possible medications 4. Hypertension, controlled  PLAN:  1. Wean and DC beta blocker therapy 2. Start diltiazem CD 180 mg per day 3. Clinical followup in 6 months 4. Consider sleep study to rule out obstructive sleep apnea, which may be precipitating episodes of atrial fibrillation   SUBJECTIVE: Yvonne Wheeler is a 63 y.o. female with multiple complaints including hair loss, insomnia, heartburn, and continued recurring episodes of atrial fibrillation although more tolerable. She does not have chest discomfort or profound weakness with atrial fibrillation occurs. She has not had syncope and denies orthopnea.   Wt Readings from Last 3 Encounters:  10/23/13 193 lb 12.8 oz (87.907 kg)  12/26/12 191 lb 12.8 oz (87 kg)  12/14/12 198 lb (89.812 kg)     Past Medical History  Diagnosis Date  . Dry eyes   . Complication of anesthesia 2004    post op nausea/vomiting and difficult to arouse  . Dysrhythmia   . Pneumonia 10/02/2012  . Atrial fibrillation 10/18/2012    Cardioversion - successful. 10/17/12   . 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 acetaminophen (TYLENOL) 500 MG tablet Take 1,000 mg by mouth every 6 (six) hours as needed for headache.       . benzonatate (TESSALON) 200 MG capsule Take 200 mg by mouth every 8 (eight) hours.      . cycloSPORINE (RESTASIS) 0.05 % ophthalmic emulsion Place 1 drop into both eyes every 12 (twelve) hours.      Marland Kitchen dextromethorphan (DELSYM) 30 MG/5ML  liquid Take 10 mg by mouth as needed for cough.      . flecainide (TAMBOCOR) 100 MG tablet Take 1 tablet (100 mg total) by mouth 2 (two) times daily.  60 tablet  12  . metoprolol succinate (TOPROL XL) 25 MG 24 hr tablet Take 1 tablet (25 mg total) by mouth daily.  30 tablet  6  . Multiple Vitamin (MULITIVITAMIN WITH MINERALS) TABS Take 1 tablet by mouth daily.      . Rivaroxaban (XARELTO) 20 MG TABS tablet Take 1 tablet (20 mg total) by mouth daily.  30 tablet  10   No current facility-administered medications for this visit.    Allergies:    Allergies  Allergen Reactions  . Codeine Nausea And Vomiting  . Sulfonamide Derivatives Hives    Social History:  The patient  reports that she has never smoked. She has never used smokeless tobacco. She reports that she drinks alcohol. She reports that she does not use illicit drugs.   ROS:  Please see the history of present illness.   No syncope. Does have hair loss, rash and intertriginous area, insomnia, and heartburn that all negative.   All other systems reviewed and negative.   OBJECTIVE: VS:  BP 134/68  Pulse 68  Ht  (1.676 m)  Wt 193 lb 12.8 oz (87.907 kg)  BMI 31.30 kg/m2  SpO2 98% Well nourished, well developed, in no acute distress,  obese HEENT: normal Neck: JVD flat. Carotid bruit absent  Cardiac:  normal S1, S2; RRR; no murmur Lungs:  clear to auscultation bilaterally, no wheezing, rhonchi or rales Abd: soft, nontender, no hepatomegaly Ext: Edema absent. Pulses 2+ Skin: warm and dry Neuro:  CNs 2-12 intact, no focal abnormalities noted  EKG:  Not performed       Signed, Darci Needle III, MD 10/23/2013 11:13 AM

## 2013-11-16 ENCOUNTER — Other Ambulatory Visit: Payer: Self-pay

## 2013-12-22 ENCOUNTER — Other Ambulatory Visit: Payer: Self-pay | Admitting: Interventional Cardiology

## 2014-04-22 ENCOUNTER — Ambulatory Visit (INDEPENDENT_AMBULATORY_CARE_PROVIDER_SITE_OTHER): Payer: BC Managed Care – PPO | Admitting: Interventional Cardiology

## 2014-04-22 ENCOUNTER — Encounter: Payer: Self-pay | Admitting: Interventional Cardiology

## 2014-04-22 ENCOUNTER — Other Ambulatory Visit: Payer: Self-pay | Admitting: Interventional Cardiology

## 2014-04-22 VITALS — BP 128/76 | HR 81 | Ht 66.0 in | Wt 196.8 lb

## 2014-04-22 DIAGNOSIS — Z5181 Encounter for therapeutic drug level monitoring: Secondary | ICD-10-CM

## 2014-04-22 DIAGNOSIS — Z79899 Other long term (current) drug therapy: Secondary | ICD-10-CM

## 2014-04-22 DIAGNOSIS — Z7901 Long term (current) use of anticoagulants: Secondary | ICD-10-CM

## 2014-04-22 DIAGNOSIS — I480 Paroxysmal atrial fibrillation: Secondary | ICD-10-CM

## 2014-04-22 LAB — BASIC METABOLIC PANEL
BUN: 18 mg/dL (ref 6–23)
CO2: 26 mEq/L (ref 19–32)
Calcium: 9.2 mg/dL (ref 8.4–10.5)
Chloride: 107 mEq/L (ref 96–112)
Creatinine, Ser: 0.91 mg/dL (ref 0.40–1.20)
GFR: 66.44 mL/min (ref >60.00)
Glucose, Bld: 111 mg/dL — ABNORMAL HIGH (ref 70–99)
Potassium: 4 mEq/L (ref 3.5–5.1)
Sodium: 140 mEq/L (ref 135–145)

## 2014-04-22 LAB — CBC WITH DIFFERENTIAL/PLATELET
Basophils Absolute: 0 10*3/uL (ref 0.0–0.1)
Basophils Relative: 0.6 % (ref 0.0–3.0)
Eosinophils Absolute: 0.1 10*3/uL (ref 0.0–0.7)
Eosinophils Relative: 4 % (ref 0.0–5.0)
HCT: 41.6 % (ref 36.0–46.0)
Hemoglobin: 14 g/dL (ref 12.0–15.0)
Lymphocytes Relative: 23 % (ref 12.0–46.0)
Lymphs Abs: 0.8 10*3/uL (ref 0.7–4.0)
MCHC: 33.6 g/dL (ref 30.0–36.0)
MCV: 89.2 fl (ref 78.0–100.0)
Monocytes Absolute: 0.3 10*3/uL (ref 0.1–1.0)
Monocytes Relative: 10 % (ref 3.0–12.0)
Neutro Abs: 2.1 10*3/uL (ref 1.4–7.7)
Neutrophils Relative %: 62.4 % (ref 43.0–77.0)
Platelets: 174 10*3/uL (ref 150.0–400.0)
RBC: 4.67 Mil/uL (ref 3.87–5.11)
RDW: 14.2 % (ref 11.5–15.5)
WBC: 3.4 10*3/uL — ABNORMAL LOW (ref 4.0–10.5)

## 2014-04-22 MED ORDER — DILTIAZEM HCL ER COATED BEADS 240 MG PO CP24: 240.0000 mg | ORAL_CAPSULE | Freq: Every day | ORAL | Status: DC

## 2014-04-22 NOTE — Patient Instructions (Signed)
Your physician has recommended you make the following change in your medication:  1) INCREASE Diltiazem to 240mg  daily. An Rx has been sent to your pharmacy  Lab Today: Bmet, Cbc  Call the office of you are still experiencing prolonged episodes of Afib  Your physician wants you to follow-up in: 1 year with Dr.Smith You will receive a reminder letter in the mail two months in advance. If you don't receive a letter, please call our office to schedule the follow-up appointment.

## 2014-04-22 NOTE — Progress Notes (Signed)
Cardiology Office Note   Date:  04/22/2014   ID:  Yvonne Wheeler, DOB [birth year], MRN [id number]  PCP:  Neldon Labella, MD  Cardiologist:   Lesleigh Noe, MD   No chief complaint on file.     History of Present Illness: Yvonne Wheeler is a 64 y.o. female who presents for follow-up of paroxysmal atrial fibrillation. She is on flecainide 100 mg twice a day. She has had occasional episodes of prolonged atrial fibrillation a cause for her to feel weak. She has not had syncope, chest pain, orthopnea, or PND. Note that her hair is improving off metoprolol. She no said diltiazem does not seem to keep her as symptom-free as a beta blocker did.    Past Medical History  Diagnosis Date  . Dry eyes   . Complication of anesthesia 2004    post op nausea/vomiting and difficult to arouse  . Dysrhythmia   . Pneumonia 10/02/2012  . Atrial fibrillation 10/18/2012    Cardioversion - successful. 10/17/12   . 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 Tonsillectomy    . Meniscus repair  08/2012  . Lithotripsy    . Cardioversion N/A 10/18/2012    Procedure: CARDIOVERSION;  Surgeon: Donato Schultz, MD;  Location: [facility] ENDOSCOPY;  Service: Cardiovascular;  Laterality: N/A;  . Cardioversion N/A 12/07/2012    Procedure: CARDIOVERSION;  Surgeon: Lesleigh Noe, MD;  Location: [facility] ENDOSCOPY;  Service: Cardiovascular;  Laterality: N/A;     Current Outpatient Prescriptions  Medication Sig Dispense Refill  . acetaminophen (TYLENOL) 500 MG tablet Take 1,000 mg by mouth every 6 (six) hours as needed for headache.     . benzonatate (TESSALON) 200 MG capsule Take 200 mg by mouth every 8 (eight) hours.    . cycloSPORINE (RESTASIS) 0.05 % ophthalmic emulsion Place 1 drop into both eyes every 12 (twelve) hours.    Marland Kitchen dextromethorphan (DELSYM) 30 MG/5ML liquid Take 10 mg by mouth at bedtime as needed for cough.     . diltiazem (CARDIZEM CD)  180 MG 24 hr capsule Take 1 capsule (180 mg total) by mouth daily. 30 capsule 11  . flecainide (TAMBOCOR) 100 MG tablet TAKE 1 TABLET TWICE DAILY. (Patient taking differently: TAKE 1 TABLET BY MOUTH TWICE DAILY.) 60 tablet 3  . latanoprost (XALATAN) 0.005 % ophthalmic solution Place 1 drop into both eyes at bedtime.    . Multiple Vitamin (MULITIVITAMIN WITH MINERALS) TABS Take 1 tablet by mouth daily.    . rivaroxaban (XARELTO) 20 MG TABS tablet Take 1 tablet (20 mg total) by mouth daily. 30 tablet 11   No current facility-administered medications for this visit.    Allergies:   Codeine and Sulfonamide derivatives    Social History:  The patient  reports that she has never smoked. She has never used smokeless tobacco. She reports that she drinks alcohol. She reports that she does not use illicit drugs.   Family History:  The patient's family history is not on file.    ROS:  Please see the history of present illness.   Otherwise, review of systems are positive for hair regrowth off metoprolol..   All other systems are reviewed and negative.    PHYSICAL EXAM: VS:  BP 128/76 mmHg  Pulse 81  Ht  (1.676 m)  Wt 196 lb 12.8 oz (89.268 kg)  BMI 31.78 kg/m2 , BMI Body mass index is  31.78 kg/(m^2). GEN: Well nourished, well developed, in no acute distress HEENT: normal Neck: no JVD, carotid bruits, or masses Cardiac: RRR; no murmurs, rubs, or gallops,no edema  Respiratory:  clear to auscultation bilaterally, normal work of breathing GI: soft, nontender, nondistended, + BS MS: no deformity or atrophy Skin: warm and dry, no rash Neuro:  Strength and sensation are intact Psych: euthymic mood, full affect   EKG:  EKG is ordered today. The ekg ordered today demonstrates sinus rhythm with left atrial abnormality and right atrial abnormality. Otherwise unremarkable.   Recent Labs: No results found for requested labs within last 365 days.    Lipid Panel No results found for: CHOL,  TRIG, HDL, CHOLHDL, VLDL, LDLCALC, LDLDIRECT    Wt Readings from Last 3 Encounters:  04/22/14 196 lb 12.8 oz (89.268 kg)  10/23/13 193 lb 12.8 oz (87.907 kg)  12/26/12 191 lb 12.8 oz (87 kg)      Other studies Reviewed: Additional studies/ records that were reviewed today include: None.    ASSESSMENT AND PLAN:  Paroxysmal atrial fibrillation - slightly more episodes of arrhythmias since discontinuation of metoprolol. Hair is improved on metoprolol.  Long term current use of anticoagulant therapy- no bleeding complications but has not had recent laboratory data  Encounter for monitoring flecainide therapy-     Current medicines are reviewed at length with the patient today.  The patient has concerns regarding medicines.  The following changes have been made:  We will increase the diltiazem CD to 240 mg per day. If she continues to have increased episodes of arrhythmia, we'll consider increasing the flecainide dose to 150 mg twice a day.  Labs/ tests ordered today include:   Orders Placed This Encounter  Procedures  . CBC w/Diff  . Basic metabolic panel  . EKG 12-Lead     Disposition:   FU with . Keshawn Fiorito in 1 Year   Signed, Lesleigh NoeSMITH III,Estes Lehner W, MD  04/22/2014 8:13 AM    [facility] [address], [place], [place]  [address] Phone: [phone](336) [phone]; Fax: [phone]) [phone]

## 2014-07-03 IMAGING — CT CT ANGIO CHEST
2 of 6 series · 19 of 36 positions shown · IV contrast ([ID] OMNI [id number])
Comparison: None

CLINICAL DATA: Shortness of breath, increased cough for 2 weeks,
recent knee surgery

CT ANGIOGRAPHY CHEST
TECHNIQUE: Multidetector CT imaging of the chest using the
standard protocol during bolus administration of intravenous
contrast. Multiplanar reconstructed images including MIPs were
obtained and reviewed to evaluate the vascular anatomy.
Contrast: 125mL OMNIPAQUE IOHEXOL 350 MG/ML SOLN

[Series 6: pe thin 1.25 · axial · 0.70mm/px · z∈[-216,+22]mm · 18 of 268 slices shown]
[im 15/268  lung]
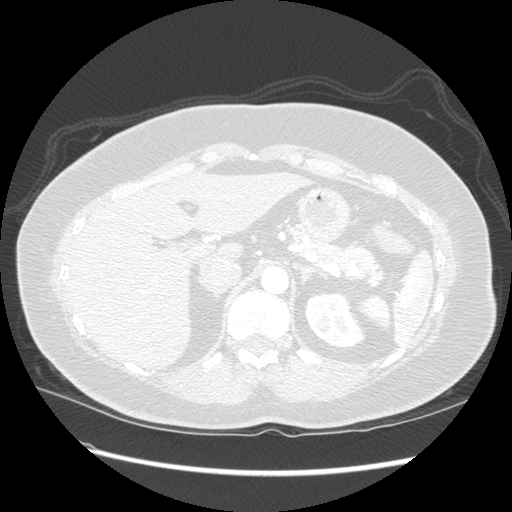
[im 29/268  mediastinal]
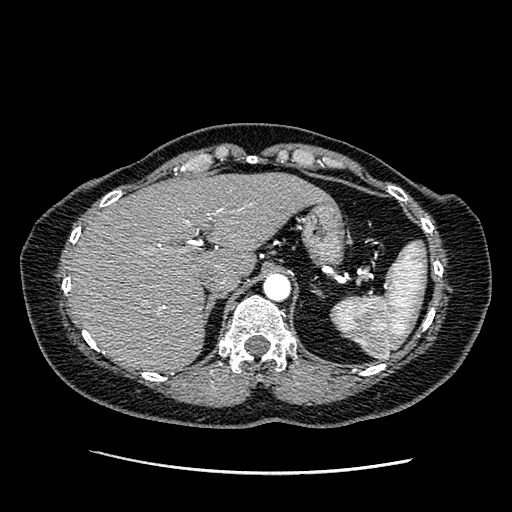
[im 43/268  lung]
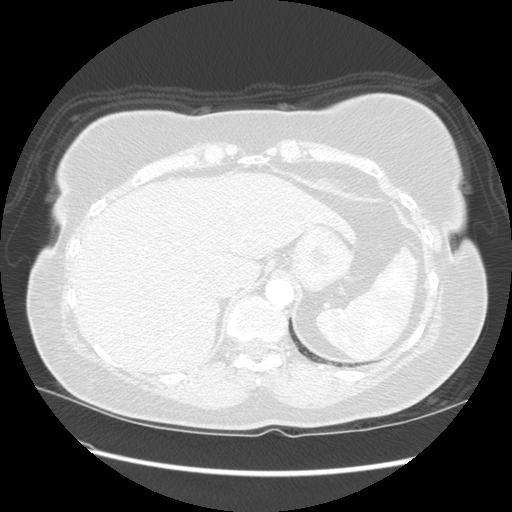
[im 57/268  mediastinal]
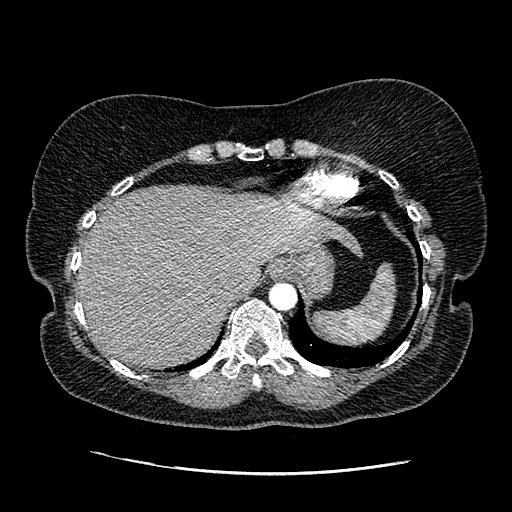
[im 71/268  lung]
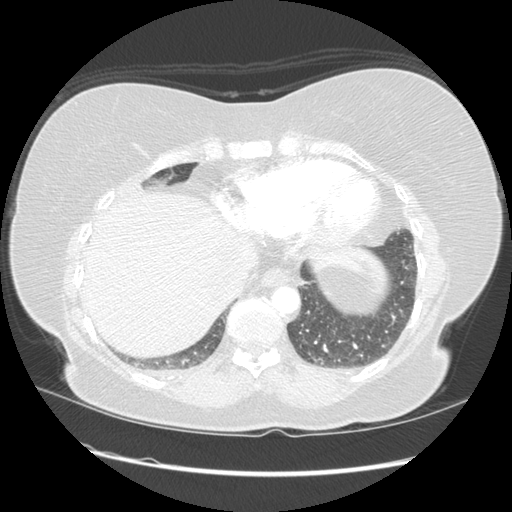
[im 85/268  mediastinal]
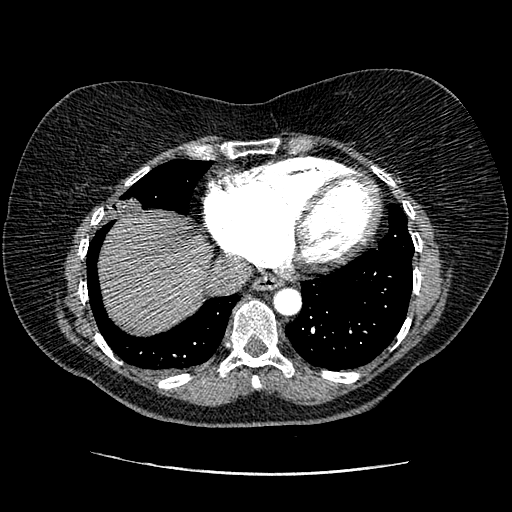
[im 99/268  lung]
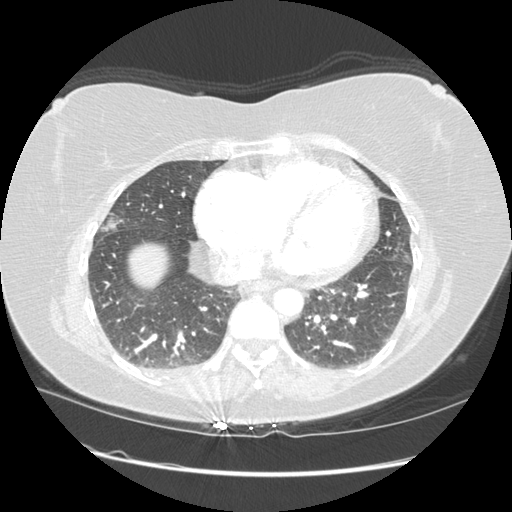
[im 113/268  mediastinal]
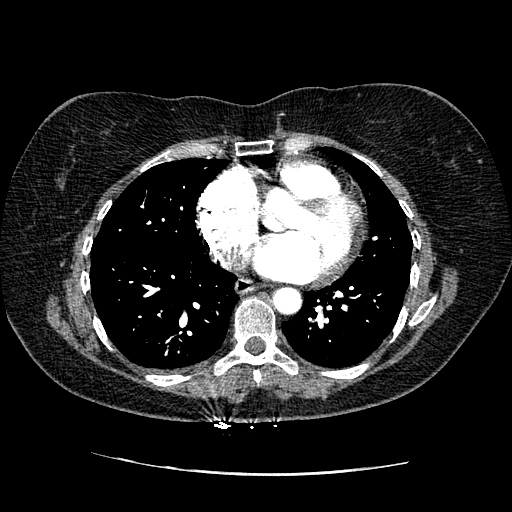
[im 127/268  lung]
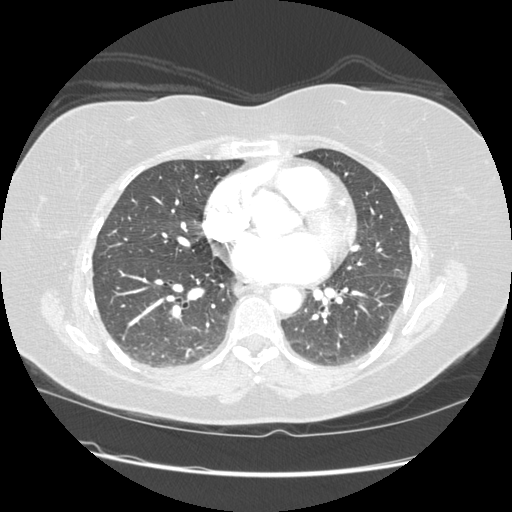
[im 141/268  mediastinal]
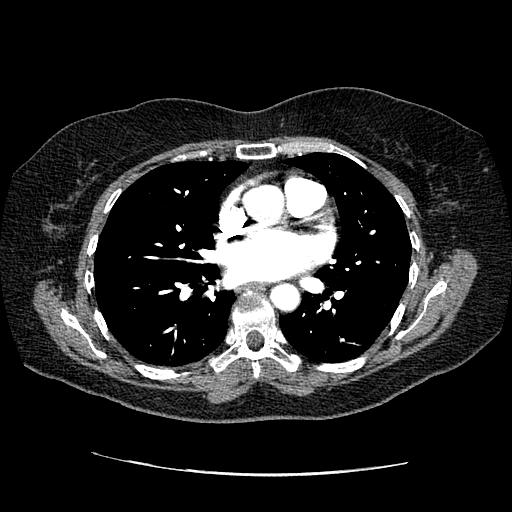
[im 155/268  lung]
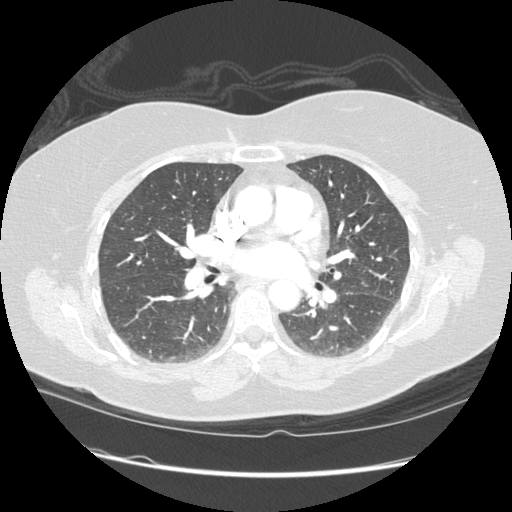
[im 169/268  mediastinal]
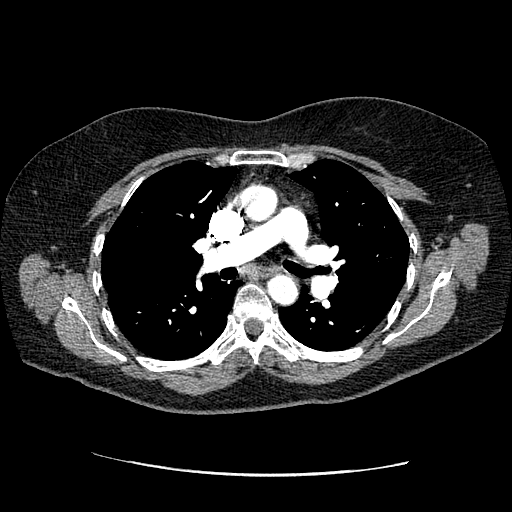
[im 183/268  lung]
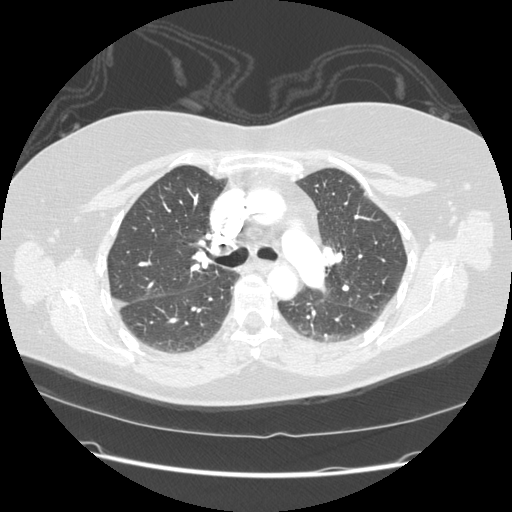
[im 197/268  mediastinal]
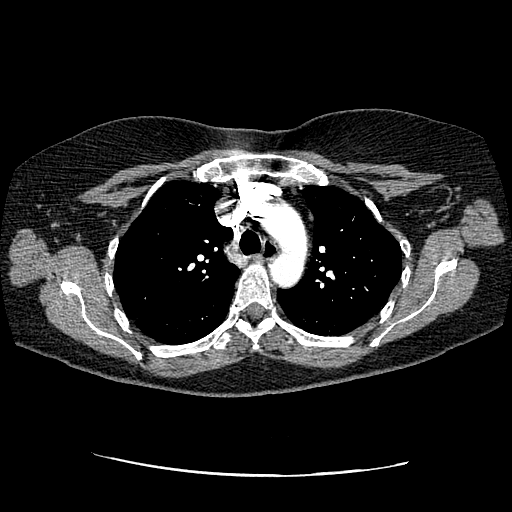
[im 211/268  lung]
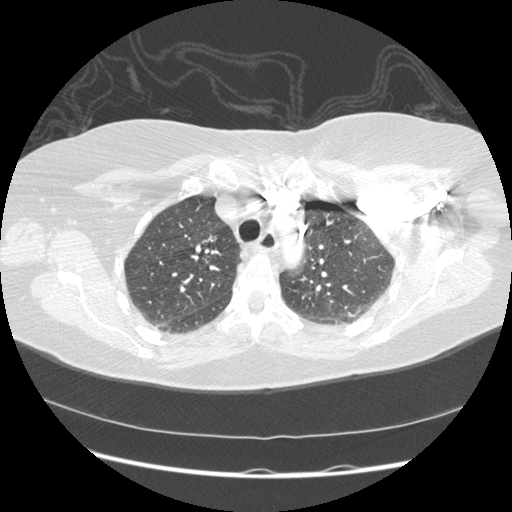
[im 225/268  mediastinal]
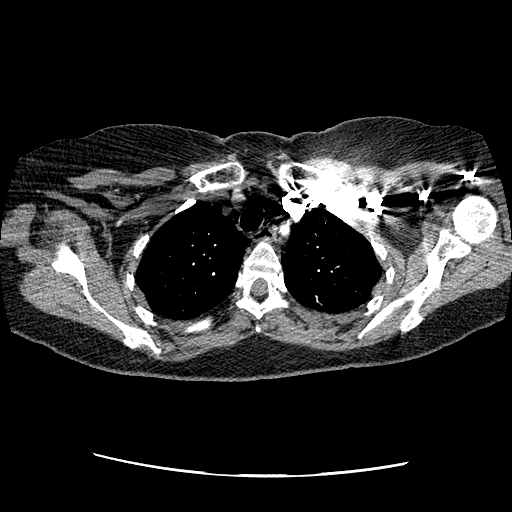
[im 239/268  lung]
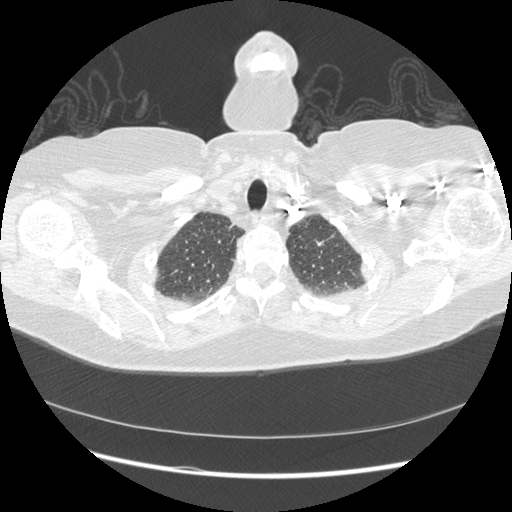
[im 253/268  mediastinal]
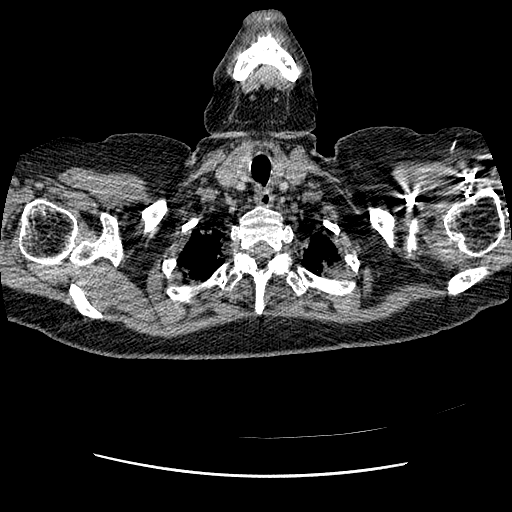

[Series 105: coronal · coronal · 0.70mm/px · 1 of 104 slices shown]
[im 52/104  mediastinal]
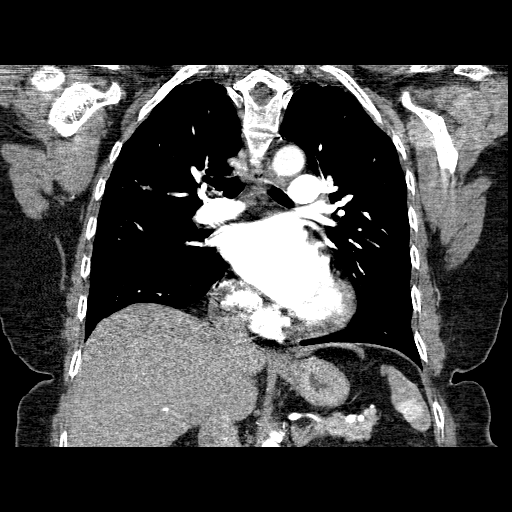

[19 of 36 positions shown; findings below may reference images not displayed]

BUN and creatinine were obtained on site at [HOSPITAL] at
[HOSPITAL]..
Results:  BUN 14 mg/dL,  Creatinine 0.8 mg/dL.
FINDINGS: The pulmonary arteries opacify well and there is no
evidence of acute pulmonary embolism.  The thoracic aorta also
opacifies with no acute abnormality. No mediastinal or hilar
adenopathy is seen.  The heart is borderline enlarged.  The portion
of the upper abdomen that is visualized is unremarkable.

On the lung window images, there is parenchymal opacity in the
inferior lateral right middle lobe suspicious for pneumonia.
Follow-up CT scan may be helpful to exclude underlying malignancy..
There does appear to be a small right pleural effusion present as
well.  On the left, no parenchymal or pleural abnormality is seen.
The central airway is patent.  No bony abnormality is noted.
IMPRESSION: 1.  No evidence of acute pulmonary embolism.
2.  Parenchymal opacity within the lateral inferior right middle
lobe suspicious for pneumonia.  However, recommend follow-up CT
chest after interval treatment to ensure clearing since this area
would not be visible on chest x-ray.

## 2014-11-10 ENCOUNTER — Other Ambulatory Visit: Payer: Self-pay | Admitting: Interventional Cardiology

## 2015-04-16 ENCOUNTER — Other Ambulatory Visit: Payer: Self-pay | Admitting: Interventional Cardiology

## 2015-06-07 ENCOUNTER — Other Ambulatory Visit: Payer: Self-pay | Admitting: Interventional Cardiology

## 2015-06-13 ENCOUNTER — Ambulatory Visit (INDEPENDENT_AMBULATORY_CARE_PROVIDER_SITE_OTHER): Payer: BC Managed Care – PPO | Admitting: Interventional Cardiology

## 2015-06-13 ENCOUNTER — Encounter: Payer: Self-pay | Admitting: Interventional Cardiology

## 2015-06-13 VITALS — BP 126/70 | HR 74 | Ht 66.0 in | Wt 197.1 lb

## 2015-06-13 DIAGNOSIS — Z79899 Other long term (current) drug therapy: Secondary | ICD-10-CM

## 2015-06-13 DIAGNOSIS — Z5181 Encounter for therapeutic drug level monitoring: Secondary | ICD-10-CM

## 2015-06-13 DIAGNOSIS — I480 Paroxysmal atrial fibrillation: Secondary | ICD-10-CM | POA: Diagnosis not present

## 2015-06-13 DIAGNOSIS — Z7901 Long term (current) use of anticoagulants: Secondary | ICD-10-CM | POA: Diagnosis not present

## 2015-06-13 NOTE — Patient Instructions (Signed)
Your physician recommends that you continue on your current medications as directed. Please refer to the Current Medication list given to you today.  Your physician wants you to follow-up in: 1 year with Dr. Katrinka BlazingSmith.  You will receive a reminder letter in the mail two months in advance. If you don't receive a letter, please call our office to schedule the follow-up appointment.  Your physician discussed the importance of regular exercise and recommended that you start or continue a regular exercise program for good health.

## 2015-06-13 NOTE — Progress Notes (Signed)
Cardiology Office Note    Date:  06/13/2015   ID:  Yvonne Wheeler, DOB January 01, 1951, MRN [id number]  PCP:  Neldon Labella, MD  Cardiologist:   Lesleigh Noe, MD   Chief Complaint  Patient presents with  . Atrial Fibrillation    History of Present Illness:  Yvonne Wheeler is a 65 y.o. female follow-up of proximal atrial fibrillation with rhythm control on flecainide.  She has atrial fibrillation up to 2 times per week. Duration can very. This does not disrupt her quality of life. We have previously discussed ablation which she is not interested in at this time. She is looking for to a trip to [place] within the next month. She denies neurological complaints. No orthopnea, PND, syncope, or palpitations lasting greater than an hour.   Past Medical History  Diagnosis Date  . Dry eyes   . Complication of anesthesia 2004    post op nausea/vomiting and difficult to arouse  . Dysrhythmia   . Pneumonia 10/02/2012  . Atrial fibrillation (HCC) 10/18/2012    Cardioversion - successful. 10/17/12   . 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 Tonsillectomy    . Meniscus repair  08/2012  . Lithotripsy    . Cardioversion N/A 10/18/2012    Procedure: CARDIOVERSION;  Surgeon: Donato Schultz, MD;  Location[facility] ENDOSCOPY;  Service: Cardiovascular;  Laterality: N/A;  . Cardioversion N/A 12/07/2012    Procedure: CARDIOVERSION;  Surgeon: Lesleigh Noe, MD;  Location: [facility] ENDOSCOPY;  Service: Cardiovascular;  Laterality: N/A;    Current Medications: Outpatient Prescriptions Prior to Visit  Medication Sig Dispense Refill  . acetaminophen (TYLENOL) 500 MG tablet Take 1,000 mg by mouth every 6 (six) hours as needed for headache.     . benzonatate (TESSALON) 200 MG capsule Take 200 mg by mouth every 8 (eight) hours.    . cycloSPORINE (RESTASIS) 0.05 % ophthalmic emulsion Place 1 drop into both eyes every 12 (twelve) hours.    Marland Kitchen  dextromethorphan (DELSYM) 30 MG/5ML liquid Take 10 mg by mouth at bedtime as needed for cough.     . latanoprost (XALATAN) 0.005 % ophthalmic solution Place 1 drop into both eyes at bedtime.    . Multiple Vitamin (MULITIVITAMIN WITH MINERALS) TABS Take 1 tablet by mouth daily.    Marland Kitchen diltiazem (CARDIZEM CD) 240 MG 24 hr capsule TAKE 1 CAPSULE DAILY. (Patient not taking: Reported on 06/13/2015) 30 capsule 1  . flecainide (TAMBOCOR) 100 MG tablet TAKE 1 TABLET TWICE DAILY. (Patient not taking: Reported on 06/13/2015) 60 tablet 1  . XARELTO 20 MG TABS tablet TAKE 1 TABLET DAILY. (Patient not taking: Reported on 06/13/2015) 30 tablet 0   No facility-administered medications prior to visit.     Allergies:   Codeine and Sulfonamide derivatives   Social History   Social History  . Marital Status: Married    Spouse Name: N/A  . Number of Children: N/A  . Years of Education: N/A   Social History Main Topics  . Smoking status: Never Smoker   . Smokeless tobacco: Never Used  . Alcohol Use: Yes     Comment: occassional  . Drug Use: No  . Sexual Activity: Not Asked   Other Topics Concern  . None   Social History Narrative     Family History:  The patient's family history includes Heart disease in her father; Irregular heart beat in her mother.  ROS:   Please see the history of present illness.   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 We discussed nostril anti-inflammatory therapy is being risky with her use of Xarelto. ROS All other systems reviewed and are negative.   PHYSICAL EXAM:   VS:  BP 126/70 mmHg  Pulse 74  Ht 5\' 6"  (1.676 m)  Wt 197 lb 1.9 oz (89.413 kg)  BMI 31.83 kg/m2   GEN: Well nourished, well developed, in no acute distress HEENT: normal Neck: no JVD, carotid bruits, or masses Cardiac: RRR; no murmurs, rubs, or gallops,no edema  Respiratory:  clear to auscultation bilaterally, normal work of breathing GI: soft, nontender, nondistended, + BS MS: no deformity  or atrophy Skin: warm and dry, no rash Neuro:  Alert and Oriented x 3, Strength and sensation are intact Psych: euthymic mood, full affect  Wt Readings from Last 3 Encounters:  06/13/15 197 lb 1.9 oz (89.413 kg)  04/22/14 196 lb 12.8 oz (89.268 kg)  10/23/13 193 lb 12.8 oz (87.907 kg)      Studies/Labs Reviewed:   EKG:  EKG isHis ordered today.  The ekg ordered today demonstrates normal sinus rhythm with atrial abnormality and otherwise unremarkable.  Recent Labs: No results found for requested labs within last 365 days.   Lipid Panel No results found for: CHOL, TRIG, HDL, CHOLHDL, VLDL, LDLCALC, LDLDIRECT  Additional studies/ records that were reviewed today include:  None    ASSESSMENT:    1. Paroxysmal atrial fibrillation (HCC)   2. Long term (current) use of anticoagulants   3. Encounter for monitoring flecainide therapy      PLAN:  In order of problems listed above:  1. Atrial fibrillation recurs relatively frequently but does not disrupt the patient's quality of life. We discussed increasing the medication regimen for better rhythm control and ablation. At the end of the conversation she feels just fine with things way that they are. 2. Anticoagulation therapy is being well tolerated 3. No difficulty with flecainide or EKG evidence of QRS widening.    Medication Adjustments/Labs and Tests Ordered: Current medicines are reviewed at length with the patient today.  Concerns regarding medicines are outlined above.  Medication changes, Labs and Tests ordered today are listed in the Patient Instructions below.  Patient Instructions  Your physician recommends that you continue on your current medications as directed. Please refer to the Current Medication list given to you today.  Your physician wants you to follow-up in: 1 year with Dr. Katrinka BlazingSmith.  You will receive a reminder letter in the mail two months in advance. If you don't receive a letter, please call our office  to schedule the follow-up appointment.  Your physician discussed the importance of regular exercise and recommended that you start or continue a regular exercise program for good health.       Signed, Lesleigh NoeHenry W Zykeriah Mathia III, MD  06/13/2015 4:46 PM    [facility] [address], [place], [place]  [address] Phone: [phone](336) [phone]; Fax: [phone]([phone]

## 2015-06-17 ENCOUNTER — Other Ambulatory Visit: Payer: Self-pay | Admitting: Interventional Cardiology

## 2015-07-09 ENCOUNTER — Other Ambulatory Visit: Payer: Self-pay | Admitting: Interventional Cardiology

## 2015-08-06 ENCOUNTER — Other Ambulatory Visit: Payer: Self-pay | Admitting: Interventional Cardiology

## 2016-06-13 NOTE — Progress Notes (Signed)
Cardiology Office Note    Date:  06/14/2016   ID:  Yvonne Wheeler, DOB 01/10/1951, MRN [id number]  PCP:  Sigmund HazelMiller, [name], MD  Cardiologist: Lesleigh NoeHenry W Smith III, MD   Chief Complaint  Patient presents with  . Atrial Fibrillation    History of Present Illness:  Yvonne FiscalCarol R Wheeler is a 66 y.o. female  follow-up of proximal atrial fibrillation with rhythm control on flecainide.  ECG this morning reveals atrial flutter with controlled rate. She is asymptomatic. She advocates compliance with medication regimen, but rarely misses a p.m. dose of flecainide.. She has not had orthopnea, PND, or lower extremity swelling. No transient neurological complaints. No bleeding on Coumadin.  Past Medical History:  Diagnosis Date  . Atrial fibrillation (HCC) 10/18/2012   Cardioversion - successful. 10/17/12   . 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 Complication of anesthesia 2004   post op nausea/vomiting and difficult to arouse  . Dry eyes   . Dysrhythmia   . Pneumonia 10/02/2012    Past Surgical History:  Procedure Laterality Date  . CARDIOVERSION N/A 10/18/2012   Procedure: CARDIOVERSION;  Surgeon: Donato SchultzMark Skains, MD;  [facility];  Service: Cardiovascular;  Laterality: N/A;  . CARDIOVERSION N/A 12/07/2012   Procedure: CARDIOVERSION;  Surgeon: Lesleigh NoeHenry W Smith III, MD;  [facility];  Service: Cardiovascular;  Laterality: N/A;  . LITHOTRIPSY    . MENISCUS REPAIR  08/2012  . TONSILLECTOMY      Current Medications: Outpatient Medications Prior to Visit  Medication Sig Dispense Refill  . acetaminophen (TYLENOL) 500 MG tablet Take 1,000 mg by mouth every 6 (six) hours as needed for headache.     . benzonatate (TESSALON) 200 MG capsule Take 200 mg by mouth every 8 (eight) hours.    . cycloSPORINE (RESTASIS) 0.05 % ophthalmic emulsion Place 1 drop into both eyes every 12 (twelve) hours.    Marland Kitchen. dextromethorphan (DELSYM) 30 MG/5ML liquid Take 10 mg by mouth at bedtime as  needed for cough.     . diltiazem (CARDIZEM CD) 240 MG 24 hr capsule Take 1 capsule (240 mg total) by mouth daily. 30 capsule 11  . flecainide (TAMBOCOR) 100 MG tablet Take 1 tablet (100 mg total) by mouth 2 (two) times daily. 60 tablet 11  . latanoprost (XALATAN) 0.005 % ophthalmic solution Place 1 drop into both eyes at bedtime.    . Multiple Vitamin (MULITIVITAMIN WITH MINERALS) TABS Take 1 tablet by mouth daily.    [name]. XARELTO 20 MG TABS tablet TAKE 1 TABLET DAILY. 30 tablet 10   No facility-administered medications prior to visit.      Allergies:   Codeine and Sulfonamide derivatives   Social History   Social History  . Marital status: Married    Spouse name: N/A  . Number of children: N/A  . Years of education: N/A   Social History Main Topics  . Smoking status: Never Smoker  . Smokeless tobacco: Never Used  . Alcohol use Yes     Comment: occassional  . Drug use: No  . Sexual activity: Not Asked   Other Topics Concern  . None   Social History Narrative  . None     Family History:  The patient's family history includes Heart disease in her father; Irregular heart beat in her mother.   ROS:   Please see the history of present illness.    Irregular and skipped heartbeats. Otherwise unremarkable.  All other systems reviewed and are  negative.   PHYSICAL EXAM:   VS:  BP 114/60 (BP Location: Left Arm)   Pulse 82   Ht 5\' 6"  (1.676 m)   Wt 197 lb 1.9 oz (89.4 kg)   BMI 31.82 kg/m    GEN: Well nourished, well developed, in no acute distress . Moderate obesity HEENT: normal  Neck: no JVD, carotid bruits, or masses Cardiac: IIRR; no murmurs, rubs, or gallops,no edema  Respiratory:  clear to auscultation bilaterally, normal work of breathing GI: soft, nontender, nondistended, + BS MS: no deformity or atrophy  Skin: warm and dry, no rash Neuro:  Alert and Oriented x 3, Strength and sensation are intact Psych: euthymic mood, full affect  Wt Readings from Last 3  Encounters:  06/14/16 197 lb 1.9 oz (89.4 kg)  06/13/15 197 lb 1.9 oz (89.4 kg)  04/22/14 196 lb 12.8 oz (89.3 kg)      Studies/Labs Reviewed:   EKG:  EKG  Atrial flutter with variable ventricular response.  Recent Labs: No results found for requested labs within last 8760 hours.   Lipid Panel No results found for: CHOL, TRIG, HDL, CHOLHDL, VLDL, LDLCALC, LDLDIRECT  Additional studies/ records that were reviewed today include:  No new data    ASSESSMENT:    1. Paroxysmal atrial fibrillation (HCC)   2. Encounter for monitoring flecainide therapy   3. Long term current use of anticoagulant therapy      PLAN:  In order of problems listed above:  1. Currently in atrial flutter with controlled rate. 48-hour monitor will be done to document whether rhythm disturbance is continuous or intermittent. No change in therapy at this time. 2. Continue current dose of flecainide. If atrial arrhythmia is persistent, we will refer to EP for consideration of ablation. 3. Continue Xarelto. No bleeding complications.  Plan to see the patient again in one year unless we documented continuous atrial flutter in which case she will require EP consultation and consideration of ablation. Further management will depend upon the 48-hour Holter monitor.    Medication Adjustments/Labs and Tests Ordered: Current medicines are reviewed at length with the patient today.  Concerns regarding medicines are outlined above.  Medication changes, Labs and Tests ordered today are listed in the Patient Instructions below. There are no Patient Instructions on file for this visit.   Signed, Lesleigh Noe, MD  06/14/2016 8:44 AM    [facility] [address] [place], [place], [place]  [address] Phone: [phone]; Fax: [phone]

## 2016-06-14 ENCOUNTER — Encounter: Payer: Self-pay | Admitting: Interventional Cardiology

## 2016-06-14 ENCOUNTER — Telehealth: Payer: Self-pay | Admitting: Interventional Cardiology

## 2016-06-14 ENCOUNTER — Ambulatory Visit (INDEPENDENT_AMBULATORY_CARE_PROVIDER_SITE_OTHER): Payer: BC Managed Care – PPO | Admitting: Interventional Cardiology

## 2016-06-14 VITALS — BP 114/60 | HR 82 | Ht 66.0 in | Wt 197.1 lb

## 2016-06-14 DIAGNOSIS — Z7901 Long term (current) use of anticoagulants: Secondary | ICD-10-CM | POA: Diagnosis not present

## 2016-06-14 DIAGNOSIS — Z79899 Other long term (current) drug therapy: Secondary | ICD-10-CM | POA: Diagnosis not present

## 2016-06-14 DIAGNOSIS — I480 Paroxysmal atrial fibrillation: Secondary | ICD-10-CM

## 2016-06-14 DIAGNOSIS — Z5181 Encounter for therapeutic drug level monitoring: Secondary | ICD-10-CM | POA: Diagnosis not present

## 2016-06-14 MED ORDER — FLECAINIDE ACETATE 100 MG PO TABS: 100.0000 mg | ORAL_TABLET | Freq: Two times a day (BID) | ORAL | 11 refills | Status: DC

## 2016-06-14 NOTE — Telephone Encounter (Signed)
New message      Pt resc her 48hr holter to 5-29th.  She want to make sure this is ok with Dr Katrinka BlazingSmith to delay it for 2 weeks.  Please call and let her know what Dr Katrinka BlazingSmith said.

## 2016-06-14 NOTE — Telephone Encounter (Signed)
Spoke with pt and she states her husband was wanting to surprise her with a trip to the beach this weekend so she wanted to reschedule her monitor appt.  Pt had appt moved to 06/29/16.  Advised that was fine.  Pt appreciative for call back.

## 2016-06-14 NOTE — Patient Instructions (Signed)
Medication Instructions:  Your physician recommends that you continue on your current medications as directed. Please refer to the Current Medication list given to you today.   Labwork: None Ordered   Testing/Procedures: Your physician has recommended that you wear a holter monitor. Holter monitors are medical devices that record the heart's electrical activity. Doctors most often use these monitors to diagnose arrhythmias. Arrhythmias are problems with the speed or rhythm of the heartbeat. The monitor is a small, portable device. You can wear one while you do your normal daily activities. This is usually used to diagnose what is causing palpitations/syncope (passing out).    Follow-Up: Your physician wants you to follow-up in: 1 year with Dr. Katrinka BlazingSmith.  You will receive a reminder letter in the mail two months in advance. If you don't receive a letter, please call our office to schedule the follow-up appointment.   If you need a refill on your cardiac medications before your next appointment, please call your pharmacy.   Thank you for choosing [facility]! Eligha BridegroomMichelle Maxon Kresse, RN [phone]

## 2016-06-15 ENCOUNTER — Other Ambulatory Visit: Payer: Self-pay | Admitting: Interventional Cardiology

## 2016-06-29 ENCOUNTER — Ambulatory Visit (INDEPENDENT_AMBULATORY_CARE_PROVIDER_SITE_OTHER): Payer: BC Managed Care – PPO

## 2016-06-29 DIAGNOSIS — I480 Paroxysmal atrial fibrillation: Secondary | ICD-10-CM | POA: Diagnosis not present

## 2016-07-01 ENCOUNTER — Other Ambulatory Visit: Payer: Self-pay | Admitting: Interventional Cardiology

## 2016-07-01 ENCOUNTER — Other Ambulatory Visit (INDEPENDENT_AMBULATORY_CARE_PROVIDER_SITE_OTHER): Payer: BC Managed Care – PPO

## 2016-07-01 DIAGNOSIS — I4891 Unspecified atrial fibrillation: Secondary | ICD-10-CM | POA: Diagnosis not present

## 2016-07-01 DIAGNOSIS — Z5181 Encounter for therapeutic drug level monitoring: Secondary | ICD-10-CM

## 2016-07-02 LAB — BASIC METABOLIC PANEL
BUN/Creatinine Ratio: 18 (ref 12–28)
BUN: 15 mg/dL (ref 8–27)
CO2: 23 mmol/L (ref 18–29)
Calcium: 9.5 mg/dL (ref 8.7–10.3)
Chloride: 102 mmol/L (ref 96–106)
Creatinine, Ser: 0.82 mg/dL (ref 0.57–1.00)
GFR calc Af Amer: 87 mL/min/{1.73_m2} (ref >59)
GFR calc non Af Amer: 76 mL/min/{1.73_m2} (ref >59)
Glucose: 87 mg/dL (ref 65–99)
Potassium: 4.4 mmol/L (ref 3.5–5.2)
Sodium: 141 mmol/L (ref 134–144)

## 2016-07-02 LAB — CBC
Hematocrit: 43.1 % (ref 34.0–46.6)
Hemoglobin: 14.8 g/dL (ref 11.1–15.9)
MCH: 31 pg (ref 26.6–33.0)
MCHC: 34.3 g/dL (ref 31.5–35.7)
MCV: 90 fL (ref 79–97)
Platelets: 203 10*3/uL (ref 150–379)
RBC: 4.78 x10E6/uL (ref 3.77–5.28)
RDW: 13.4 % (ref 12.3–15.4)
WBC: 4.4 10*3/uL (ref 3.4–10.8)

## 2016-07-02 NOTE — Telephone Encounter (Signed)
Age 66 yrs Wt 89.4kg 06/14/2016 Saw Dr Katrinka BlazingSmith on 06/14/2016 SrCr 0.82 07/01/2016 Hgb 14.8 Hct 43.1 07/01/2016 CrCl 97.8 Refill done as requested for Xarelto 20 mg daily

## 2017-01-01 ENCOUNTER — Other Ambulatory Visit: Payer: Self-pay | Admitting: Interventional Cardiology

## 2017-01-03 NOTE — Telephone Encounter (Signed)
Xarelto 20mg  refill request received; pt is 66 yrs old, Crea-0.82 on 07/01/16, Wt-89.4kg, last seen by Dr. Katrinka BlazingSmith on 06/14/16, CrCl-96.1253ml/min, CrCl-96.1553ml/min. Will send in refill request to requested pharmacy.

## 2017-06-07 ENCOUNTER — Other Ambulatory Visit: Payer: Self-pay | Admitting: Interventional Cardiology

## 2017-07-05 ENCOUNTER — Other Ambulatory Visit: Payer: Self-pay | Admitting: Interventional Cardiology

## 2017-07-05 NOTE — Telephone Encounter (Signed)
Pt last saw Dr Katrinka BlazingSmith 06/14/16, has 1 yr f/u scheduled with Dr Katrinka BlazingSmith for 08/31/17.  Last labs were 07/01/16 Creat 0.82, placed note on upcoming appt with Dr Katrinka BlazingSmith to draw CBC and BMP as pt is also overdue for labwork.  Age 67, weight 89.4kg, CrCl was previously 96.53, will refill rx x 2 months to get pt to appt on 08/31/17.

## 2017-08-31 ENCOUNTER — Encounter: Payer: Self-pay | Admitting: Interventional Cardiology

## 2017-08-31 ENCOUNTER — Ambulatory Visit: Payer: Medicare Other | Admitting: Interventional Cardiology

## 2017-08-31 VITALS — BP 124/72 | HR 69 | Ht 66.0 in | Wt 172.0 lb

## 2017-08-31 DIAGNOSIS — Z5181 Encounter for therapeutic drug level monitoring: Secondary | ICD-10-CM

## 2017-08-31 DIAGNOSIS — Z7901 Long term (current) use of anticoagulants: Secondary | ICD-10-CM | POA: Diagnosis not present

## 2017-08-31 DIAGNOSIS — I480 Paroxysmal atrial fibrillation: Secondary | ICD-10-CM

## 2017-08-31 NOTE — Progress Notes (Signed)
Cardiology Office Note:    Date:  08/31/2017   ID:  Yvonne Wheeler, DOB 09/04/1950, MRN [id number]  PCP:  Yvonne Wheeler, Yvonne Wheeler, Yvonne Wheeler  Cardiologist:  No primary care provider on file.   Referring Yvonne Wheeler: Yvonne Wheeler, Yvonne Wheeler, Yvonne Wheeler   Chief Complaint  Patient presents with  . Atrial Fibrillation    History of Present Illness:    Yvonne Wheeler is a 67 y.o. female with a hx of paroxysmalatrial fibrillation with rhythm control on flecainide and chronic anticoagulation..  She is doing well.  She has occasional atrial fibrillation.  She notes this is being a sensation of palpitation and overall weakness.  Episodes last hours before resolving.  She has had no complications or medication side effects.   Past Medical History:  Diagnosis Date  . Atrial fibrillation (HCC) 10/18/2012   Cardioversion - successful. 10/17/12   . 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 Complication of anesthesia 2004   post op nausea/vomiting and difficult to arouse  . Dry eyes   . Dysrhythmia   . Pneumonia 10/02/2012    Past Surgical History:  Procedure Laterality Date  . CARDIOVERSION N/A 10/18/2012   Procedure: CARDIOVERSION;  Surgeon: Donato SchultzMark Skains, Yvonne Wheeler;  [facility];  Service: Cardiovascular;  Laterality: N/A;  . CARDIOVERSION N/A 12/07/2012   Procedure: CARDIOVERSION;  Surgeon: Lesleigh NoeHenry W Tishawn Friedhoff III, Yvonne Wheeler;  [facility];  Service: Cardiovascular;  Laterality: N/A;  . LITHOTRIPSY    . MENISCUS REPAIR  08/2012  . TONSILLECTOMY      Current Medications: Current Meds  Medication Sig  . acetaminophen (TYLENOL) 500 MG tablet Take 1,000 mg by mouth every 6 (six) hours as needed for headache.   . benzonatate (TESSALON) 200 MG capsule Take 200 mg by mouth 3 (three) times daily as needed.   . cycloSPORINE (RESTASIS) 0.05 % ophthalmic emulsion Place 1 drop into both eyes every 12 (twelve) hours.  Marland Kitchen. dextromethorphan (DELSYM) 30 MG/5ML liquid Take 10 mg by mouth at bedtime as needed for cough.   .  diltiazem (CARDIZEM CD) 240 MG 24 hr capsule Take 1 capsule (240 mg total) by mouth daily.  . flecainide (TAMBOCOR) 100 MG tablet Take 1 tablet (100 mg total) by mouth 2 (two) times daily.  Marland Kitchen. latanoprost (XALATAN) 0.005 % ophthalmic solution Place 1 drop into both eyes at bedtime.  . Multiple Vitamin (MULITIVITAMIN WITH MINERALS) TABS Take 1 tablet by mouth daily.  [name]. XARELTO 20 MG TABS tablet TAKE 1 TABLET BY MOUTH DAILY.     Allergies:   Codeine and Sulfonamide derivatives   Social History   Socioeconomic History  . Marital status: Married    Spouse name: Not on file  . Number of children: Not on file  . Years of education: Not on file  . Highest education level: Not on file  Occupational History  . Not on file  Social Needs  . Financial resource strain: Not on file  . Food insecurity:    Worry: Not on file    Inability: Not on file  . Transportation needs:    Medical: Not on file    Non-medical: Not on file  Tobacco Use  . Smoking status: Never Smoker  . Smokeless tobacco: Never Used  Substance and Sexual Activity  . Alcohol use: Yes    Comment: occassional  . Drug use: No  . Sexual activity: Not on file  Lifestyle  . Physical activity:    Days per week: Not on file  Minutes per session: Not on file  . Stress: Not on file  Relationships  . Social connections:    Talks on phone: Not on file    Gets together: Not on file    Attends religious service: Not on file    Active member of club or organization: Not on file    Attends meetings of clubs or organizations: Not on file    Relationship status: Not on file  Other Topics Concern  . Not on file  Social History Narrative  . Not on file     Family History: The patient's family history includes Heart disease in her father; Irregular heart beat in her mother.  ROS:   Please see the history of present illness.    Easy bruising, occasional headaches, skipped beats, irregular heartbeats, leg swelling.  All other  systems reviewed and are negative.  EKGs/Labs/Other Studies Reviewed:    The following studies were reviewed today: No recent data  EKG:  EKG is  ordered today.  The ekg ordered today demonstrates sinus rhythm with normal intervals biatrial abnormality.  Recent Labs: No results found for requested labs within last 8760 hours.  Recent Lipid Panel No results found for: CHOL, TRIG, HDL, CHOLHDL, VLDL, LDLCALC, LDLDIRECT  Physical Exam:    VS:  BP 124/72   Pulse 69   Ht 5\' 6"  (1.676 m)   Wt 172 lb (78 kg)   BMI 27.76 kg/m     Wt Readings from Last 3 Encounters:  08/31/17 172 lb (78 kg)  06/14/16 197 lb 1.9 oz (89.4 kg)  06/13/15 197 lb 1.9 oz (89.4 kg)     GEN:  Well nourished, well developed in no acute distress HEENT: Normal NECK: No JVD. LYMPHATICS: No lymphadenopathy CARDIAC: RRR, no murmur, no gallop, no edema. VASCULAR: 2+ radial pulses.  No bruits. RESPIRATORY:  Clear to auscultation without rales, wheezing or rhonchi  ABDOMEN: Soft, non-tender, non-distended, No pulsatile mass, MUSCULOSKELETAL: No deformity  SKIN: Warm and dry NEUROLOGIC:  Alert and oriented x 3 PSYCHIATRIC:  Normal affect   ASSESSMENT:    1. Paroxysmal atrial fibrillation (HCC)   2. Long term current use of anticoagulant therapy   3. Encounter for therapeutic drug monitoring    PLAN:    In order of problems listed above:  1. Rhythm control with her milligrams.  Continue to monitor.  Basic metabolic panel and hemoglobin will be obtained today for monitoring both anticoagulation therapy and antiarrhythmic therapy. 2. Continue Xarelto.  Hemoglobin and kidney function today. 3. Continue to monitor for evidence of arrhythmia or medication failure.  Follow-up in 1 year.   Medication Adjustments/Labs and Tests Ordered: Current medicines are reviewed at length with the patient today.  Concerns regarding medicines are outlined above.  No orders of the defined types were placed in this  encounter.  No orders of the defined types were placed in this encounter.   There are no Patient Instructions on file for this visit.   Signed, Lesleigh Noe, Yvonne Wheeler  08/31/2017 8:51 AM    [facility]

## 2017-08-31 NOTE — Patient Instructions (Signed)
Medication Instructions:  Your physician recommends that you continue on your current medications as directed. Please refer to the Current Medication list given to you today.  Labwork: BMET, CBC and Liver function today  Testing/Procedures: None  Follow-Up: Your physician wants you to follow-up in: 1 year with Dr. Katrinka BlazingSmith.  You will receive a reminder letter in the mail two months in advance. If you don't receive a letter, please call our office to schedule the follow-up appointment.   Any Other Special Instructions Will Be Listed Below (If Applicable).     If you need a refill on your cardiac medications before your next appointment, please call your pharmacy.

## 2017-09-01 ENCOUNTER — Other Ambulatory Visit: Payer: Self-pay | Admitting: Interventional Cardiology

## 2017-09-01 LAB — BASIC METABOLIC PANEL
BUN/Creatinine Ratio: 16 (ref 12–28)
BUN: 14 mg/dL (ref 8–27)
CO2: 24 mmol/L (ref 20–29)
Calcium: 9.6 mg/dL (ref 8.7–10.3)
Chloride: 103 mmol/L (ref 96–106)
Creatinine, Ser: 0.89 mg/dL (ref 0.57–1.00)
GFR calc Af Amer: 79 mL/min/{1.73_m2} (ref >59)
GFR calc non Af Amer: 68 mL/min/{1.73_m2} (ref >59)
Glucose: 86 mg/dL (ref 65–99)
Potassium: 4.5 mmol/L (ref 3.5–5.2)
Sodium: 143 mmol/L (ref 134–144)

## 2017-09-01 LAB — HEPATIC FUNCTION PANEL
ALT: 14 IU/L (ref 0–32)
AST: 13 IU/L (ref 0–40)
Albumin: 4.2 g/dL (ref 3.6–4.8)
Alkaline Phosphatase: 103 IU/L (ref 39–117)
Bilirubin Total: 0.4 mg/dL (ref 0.0–1.2)
Bilirubin, Direct: 0.13 mg/dL (ref 0.00–0.40)
Total Protein: 6.5 g/dL (ref 6.0–8.5)

## 2017-09-01 LAB — CBC
Hematocrit: 43.7 % (ref 34.0–46.6)
Hemoglobin: 14.9 g/dL (ref 11.1–15.9)
MCH: 31.1 pg (ref 26.6–33.0)
MCHC: 34.1 g/dL (ref 31.5–35.7)
MCV: 91 fL (ref 79–97)
Platelets: 207 10*3/uL (ref 150–450)
RBC: 4.79 x10E6/uL (ref 3.77–5.28)
RDW: 13.2 % (ref 12.3–15.4)
WBC: 3.4 10*3/uL (ref 3.4–10.8)

## 2017-09-08 ENCOUNTER — Other Ambulatory Visit: Payer: Self-pay | Admitting: Interventional Cardiology

## 2018-03-03 ENCOUNTER — Other Ambulatory Visit: Payer: Self-pay | Admitting: Interventional Cardiology

## 2018-03-31 ENCOUNTER — Other Ambulatory Visit: Payer: Self-pay | Admitting: Interventional Cardiology

## 2018-03-31 NOTE — Telephone Encounter (Signed)
Pt last saw Dr Katrinka Blazing 08/31/17, last labs 08/31/17 Creat 0.89, age 68, weight 78kg, CrCl 76.56, based on CrCl pt is on appropriate dosage of Xarelto 20mg  QD.  Will refill rx.

## 2018-09-11 ENCOUNTER — Other Ambulatory Visit: Payer: Self-pay | Admitting: Interventional Cardiology

## 2018-09-21 ENCOUNTER — Telehealth: Payer: Self-pay

## 2018-09-21 NOTE — Telephone Encounter (Signed)
Contacted pt to switch from office to virtual appt. Pt is more comfortable coming into the office for her yearly appt. Pt has been rescheduled for 12/08/2018 at 8:20am.

## 2018-09-25 ENCOUNTER — Ambulatory Visit: Payer: Medicare Other | Admitting: Interventional Cardiology

## 2018-10-01 ENCOUNTER — Other Ambulatory Visit: Payer: Self-pay | Admitting: Interventional Cardiology

## 2018-10-02 NOTE — Telephone Encounter (Signed)
Age 68, weight 78kg, SCr 0.89 on 08/31/17, CrCl 74mL/min. Pt has annual appt scheduled with Dr Tamala Julian in November, added note to recheck BMET and CBC then.

## 2018-12-06 NOTE — Progress Notes (Signed)
Cardiology Office Note:    Date:  12/08/2018   ID:  [name], DOB [birth year], MRN [id number]  PCP:  Sigmund Hazel, MD  Cardiologist:  Lesleigh Noe, MD   Referring MD: Sigmund Hazel, MD   Chief Complaint  Patient presents with  . Atrial Fibrillation    History of Present Illness:    Yvonne Wheeler is a 68 y.o. female with a hx of paroxysmalatrial fibrillation with rhythm control on flecainide and chronic anticoagulation..  She does have intermittent episodes of palpitations that she feels may be atrial fibrillation.  These episodes are increasingly frequent but very brief.  She is taking flecainide twice daily.  She has had no neurological complaints.  No significant bleeding issues on Xarelto.  Past Medical History:  Diagnosis Date  . Atrial fibrillation (HCC) 10/18/2012   Cardioversion - successful. 10/17/12   . 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 Complication of anesthesia 2004   post op nausea/vomiting and difficult to arouse  . Dry eyes   . Dysrhythmia   . Pneumonia 10/02/2012    Past Surgical History:  Procedure Laterality Date  . CARDIOVERSION N/A 10/18/2012   Procedure: CARDIOVERSION;  Surgeon: Donato Schultz, MD;  Location: [facility] ENDOSCOPY;  Service: Cardiovascular;  Laterality: N/A;  . CARDIOVERSION N/A 12/07/2012   Procedure: CARDIOVERSION;  Surgeon: Lesleigh Noe, MD;  Location[facility]COPY;  Service: Cardiovascular;  Laterality: N/A;  . LITHOTRIPSY    . MENISCUS REPAIR  08/2012  . TONSILLECTOMY      Current Medications: Current Meds  Medication Sig  . acetaminophen (TYLENOL) 500 MG tablet Take 1,000 mg by mouth every 6 (six) hours as needed for headache.   . cycloSPORINE (RESTASIS) 0.05 % ophthalmic emulsion Place 1 drop into both eyes every 12 (twelve) hours.  Marland Kitchen diltiazem (CARDIZEM CD) 240 MG 24 hr capsule TAKE 1 CAPSULE DAILY.  . flecainide (TAMBOCOR) 100 MG tablet TAKE 1 TABLET BY MOUTH TWICE DAILY.  Marland Kitchen latanoprost (XALATAN)  0.005 % ophthalmic solution Place 1 drop into both eyes at bedtime.  . Multiple Vitamin (MULITIVITAMIN WITH MINERALS) TABS Take 1 tablet by mouth daily.  . rivaroxaban (XARELTO) 20 MG TABS tablet Take 1 tablet (20 mg total) by mouth daily with supper.     Allergies:   Codeine and Sulfonamide derivatives   Social History   Socioeconomic History  . Marital status: Married    Spouse name: Not on file  . Number of children: Not on file  . Years of education: Not on file  . Highest education level: Not on file  Occupational History  . Not on file  Social Needs  . Financial resource strain: Not on file  . Food insecurity    Worry: Not on file    Inability: Not on file  . Transportation needs    Medical: Not on file    Non-medical: Not on file  Tobacco Use  . Smoking status: Never Smoker  . Smokeless tobacco: Never Used  Substance and Sexual Activity  . Alcohol use: Yes    Comment: occassional  . Drug use: No  . Sexual activity: Not on file  Lifestyle  . Physical activity    Days per week: Not on file    Minutes per session: Not on file  . Stress: Not on file  Relationships  . Social Musician on phone: Not on file    Gets together: Not on file  Attends religious service: Not on file    Active member of club or organization: Not on file    Attends meetings of clubs or organizations: Not on file    Relationship status: Not on file  Other Topics Concern  . Not on file  Social History Narrative  . Not on file     Family History: The patient's family history includes Heart disease in her father; Irregular heart beat in her mother.  ROS:   Please see the history of present illness.    She has been under stress.  Her husband had an MI earlier this year and was stented by Glenetta Hew.  He is doing great but she has been worried about his longevity.  All other systems reviewed and are negative.  EKGs/Labs/Other Studies Reviewed:    The following studies  were reviewed today: No new data  EKG:  EKG normal sinus rhythm with normal EKG appearance.  QTC 420 ms.  Biatrial abnormality is noted.  Degree AV block is noted.  Recent Labs: No results found for requested labs within last 8760 hours.  Recent Lipid Panel No results found for: CHOL, TRIG, HDL, CHOLHDL, VLDL, LDLCALC, LDLDIRECT  Physical Exam:    VS:  BP 132/62   Pulse 73   Ht 5\' 6"  (1.676 m)   Wt 172 lb 1.9 oz (78.1 kg)   SpO2 97%   BMI 27.78 kg/m     Wt Readings from Last 3 Encounters:  12/08/18 172 lb 1.9 oz (78.1 kg)  08/31/17 172 lb (78 kg)  06/14/16 197 lb 1.9 oz (89.4 kg)     GEN: Mild to moderate obesity.. No acute distress HEENT: Normal NECK: No JVD. LYMPHATICS: No lymphadenopathy CARDIAC:  RRR without murmur, gallop, or edema. VASCULAR:  Normal Pulses. No bruits. RESPIRATORY:  Clear to auscultation without rales, wheezing or rhonchi  ABDOMEN: Soft, non-tender, non-distended, No pulsatile mass, MUSCULOSKELETAL: No deformity  SKIN: Warm and dry NEUROLOGIC:  Alert and oriented x 3 PSYCHIATRIC:  Normal affect   ASSESSMENT:    1. Paroxysmal atrial fibrillation (HCC)   2. Long term current use of anticoagulant therapy   3. Encounter for therapeutic drug monitoring   4. Educated about COVID-19 virus infection    PLAN:    In order of problems listed above:  1. Controlled with flecainide twice daily..  On long-term anticoagulation therapy due to risk or greater than 2. 2. Continue Xarelto. 3. No clinical/electrocardiographic abnormalities on flecainide other than first-degree AV block with 200 ms.  This will be watched. 4. The 3W's were discussed and endorsed by the patient is lifestyle changes.  Overall education and awareness concerning primary risk prevention was discussed in detail: LDL less than 70, hemoglobin A1c less than 7, blood pressure target less than 130/80 mmHg, >150 minutes of moderate aerobic activity per week, avoidance of smoking, weight  control (via diet and exercise), and continued surveillance/management of/for obstructive sleep apnea.    Medication Adjustments/Labs and Tests Ordered: Current medicines are reviewed at length with the patient today.  Concerns regarding medicines are outlined above.  No orders of the defined types were placed in this encounter.  No orders of the defined types were placed in this encounter.   There are no Patient Instructions on file for this visit.   Signed, Sinclair Grooms, MD  12/08/2018 8:51 AM    [place]

## 2018-12-08 ENCOUNTER — Ambulatory Visit: Payer: Medicare Other | Admitting: Interventional Cardiology

## 2018-12-08 ENCOUNTER — Other Ambulatory Visit: Payer: Self-pay

## 2018-12-08 ENCOUNTER — Encounter: Payer: Self-pay | Admitting: Interventional Cardiology

## 2018-12-08 VITALS — BP 132/62 | HR 73 | Ht 66.0 in | Wt 172.1 lb

## 2018-12-08 DIAGNOSIS — Z7189 Other specified counseling: Secondary | ICD-10-CM | POA: Diagnosis not present

## 2018-12-08 DIAGNOSIS — Z7901 Long term (current) use of anticoagulants: Secondary | ICD-10-CM

## 2018-12-08 DIAGNOSIS — I480 Paroxysmal atrial fibrillation: Secondary | ICD-10-CM | POA: Diagnosis not present

## 2018-12-08 DIAGNOSIS — Z5181 Encounter for therapeutic drug level monitoring: Secondary | ICD-10-CM | POA: Diagnosis not present

## 2018-12-08 LAB — LIPID PANEL
Chol/HDL Ratio: 2.5 ratio (ref 0.0–4.4)
Cholesterol, Total: 199 mg/dL (ref 100–199)
HDL: 80 mg/dL (ref >39)
LDL Chol Calc (NIH): 111 mg/dL — ABNORMAL HIGH (ref 0–99)
Triglycerides: 41 mg/dL (ref 0–149)
VLDL Cholesterol Cal: 8 mg/dL (ref 5–40)

## 2018-12-08 LAB — HEPATIC FUNCTION PANEL
ALT: 16 IU/L (ref 0–32)
AST: 18 IU/L (ref 0–40)
Albumin: 4.2 g/dL (ref 3.8–4.8)
Alkaline Phosphatase: 112 IU/L (ref 39–117)
Bilirubin Total: 0.2 mg/dL (ref 0.0–1.2)
Bilirubin, Direct: 0.09 mg/dL (ref 0.00–0.40)
Total Protein: 6.8 g/dL (ref 6.0–8.5)

## 2018-12-08 LAB — CBC
Hematocrit: 41.7 % (ref 34.0–46.6)
Hemoglobin: 13.9 g/dL (ref 11.1–15.9)
MCH: 29.6 pg (ref 26.6–33.0)
MCHC: 33.3 g/dL (ref 31.5–35.7)
MCV: 89 fL (ref 79–97)
Platelets: 212 10*3/uL (ref 150–450)
RBC: 4.7 x10E6/uL (ref 3.77–5.28)
RDW: 11.7 % (ref 11.7–15.4)
WBC: 3.9 10*3/uL (ref 3.4–10.8)

## 2018-12-08 LAB — BASIC METABOLIC PANEL
BUN/Creatinine Ratio: 16 (ref 12–28)
BUN: 13 mg/dL (ref 8–27)
CO2: 21 mmol/L (ref 20–29)
Calcium: 9.4 mg/dL (ref 8.7–10.3)
Chloride: 105 mmol/L (ref 96–106)
Creatinine, Ser: 0.83 mg/dL (ref 0.57–1.00)
GFR calc Af Amer: 84 mL/min/{1.73_m2} (ref >59)
GFR calc non Af Amer: 73 mL/min/{1.73_m2} (ref >59)
Glucose: 106 mg/dL — ABNORMAL HIGH (ref 65–99)
Potassium: 4.3 mmol/L (ref 3.5–5.2)
Sodium: 142 mmol/L (ref 134–144)

## 2018-12-08 NOTE — Patient Instructions (Signed)
Medication Instructions:  Your physician recommends that you continue on your current medications as directed. Please refer to the Current Medication list given to you today.  *If you need a refill on your cardiac medications before your next appointment, please call your pharmacy*  Lab Work: BMET, CBC, Liver, and Lipid today  If you have labs (blood work) drawn today and your tests are completely normal, you will receive your results only by: Marland Kitchen MyChart Message (if you have MyChart) OR . A paper copy in the mail If you have any lab test that is abnormal or we need to change your treatment, we will call you to review the results.  Testing/Procedures: None  Follow-Up: At [facility], you and your health needs are our priority.  As part of our continuing mission to provide you with exceptional heart care, we have created designated Provider Care Teams.  These Care Teams include your primary Cardiologist (physician) and Advanced Practice Providers (APPs -  Physician Assistants and Nurse Practitioners) who all work together to provide you with the care you need, when you need it.  Your next appointment:   12 months  The format for your next appointment:   In Person  Provider:   You may see Sinclair Grooms, MD or one of the following Advanced Practice Providers on your designated Care Team:    Truitt Merle, NP  Cecilie Kicks, NP  Kathyrn Drown, NP   Other Instructions

## 2018-12-11 ENCOUNTER — Telehealth: Payer: Self-pay | Admitting: *Deleted

## 2018-12-11 ENCOUNTER — Other Ambulatory Visit: Payer: Self-pay | Admitting: Interventional Cardiology

## 2018-12-11 DIAGNOSIS — E785 Hyperlipidemia, unspecified: Secondary | ICD-10-CM

## 2018-12-11 MED ORDER — ROSUVASTATIN CALCIUM 5 MG PO TABS: 5.0000 mg | ORAL_TABLET | Freq: Every day | ORAL | 3 refills | Status: DC

## 2018-12-11 NOTE — Telephone Encounter (Signed)
Spoke with pt and went over results and recommendations per Dr. Tamala Julian.  Scheduled labs for 02/12/2019.  Pt verbalized understanding and was appreciative for call.

## 2018-12-11 NOTE — Telephone Encounter (Signed)
-----   Message from Belva Crome, MD sent at 12/10/2018  1:27 PM EST ----- Let the patient know the cholesterol is mildly elevated. I would recommend low dose therapy to get LDL near 70. Rosuvastatin 5 mg daily. Liver and lipid panel in 2 months. A copy will be sent to Kathyrn Lass, MD

## 2019-02-12 ENCOUNTER — Other Ambulatory Visit: Payer: Self-pay

## 2019-02-12 ENCOUNTER — Other Ambulatory Visit: Payer: Medicare PPO | Admitting: *Deleted

## 2019-02-12 DIAGNOSIS — E785 Hyperlipidemia, unspecified: Secondary | ICD-10-CM

## 2019-02-12 LAB — HEPATIC FUNCTION PANEL
ALT: 15 IU/L (ref 0–32)
AST: 18 IU/L (ref 0–40)
Albumin: 4.2 g/dL (ref 3.8–4.8)
Alkaline Phosphatase: 120 IU/L — ABNORMAL HIGH (ref 39–117)
Bilirubin Total: 0.4 mg/dL (ref 0.0–1.2)
Bilirubin, Direct: 0.13 mg/dL (ref 0.00–0.40)
Total Protein: 6.8 g/dL (ref 6.0–8.5)

## 2019-02-12 LAB — LIPID PANEL
Chol/HDL Ratio: 1.8 ratio (ref 0.0–4.4)
Cholesterol, Total: 154 mg/dL (ref 100–199)
HDL: 84 mg/dL (ref >39)
LDL Chol Calc (NIH): 62 mg/dL (ref 0–99)
Triglycerides: 31 mg/dL (ref 0–149)
VLDL Cholesterol Cal: 8 mg/dL (ref 5–40)

## 2019-02-20 ENCOUNTER — Ambulatory Visit: Payer: Medicare PPO | Attending: Internal Medicine

## 2019-02-20 NOTE — Progress Notes (Signed)
   Covid-19 Vaccination Clinic  Name:  Yvonne Wheeler    MRN: [id number] DOB: 01/07/1951  02/20/2019  Yvonne Wheeler was observed post Covid-19 immunization for 15 minutes without incidence. She was provided with Vaccine Information Sheet and instruction to access the V-Safe system.   Yvonne Wheeler was instructed to call 911 with any severe reactions post vaccine: Marland Kitchen Difficulty breathing  . Swelling of your face and throat  . A fast heartbeat  . A bad rash all over your body  . Dizziness and weakness

## 2019-03-10 ENCOUNTER — Other Ambulatory Visit: Payer: Self-pay | Admitting: Interventional Cardiology

## 2019-03-11 ENCOUNTER — Other Ambulatory Visit: Payer: Self-pay | Admitting: Interventional Cardiology

## 2019-03-13 ENCOUNTER — Ambulatory Visit: Payer: Medicare PPO | Attending: Internal Medicine

## 2019-03-13 DIAGNOSIS — Z23 Encounter for immunization: Secondary | ICD-10-CM | POA: Insufficient documentation

## 2019-03-13 NOTE — Progress Notes (Signed)
   Covid-19 Vaccination Clinic  Name:  Yvonne Wheeler    MRN: [id number] DOB: 12-29-1950  03/13/2019  [name] was observed post Covid-19 immunization for 15 minutes without incidence. She was provided with Vaccine Information Sheet and instruction to access the V-Safe system.   [name] was instructed to call 911 with any severe reactions post vaccine: Marland Kitchen Difficulty breathing  . Swelling of your face and throat  . A fast heartbeat  . A bad rash all over your body  . Dizziness and weakness    Immunizations Administered    Name Date Dose VIS Date Route   Pfizer COVID-19 Vaccine 03/13/2019  9:15 AM 0.3 mL 01/12/2019 Intramuscular   Manufacturer: [employer], [employer]   Lot: [id number]   NDC: [address]-7

## 2019-03-30 ENCOUNTER — Other Ambulatory Visit: Payer: Self-pay | Admitting: Interventional Cardiology

## 2019-03-30 NOTE — Telephone Encounter (Signed)
Prescription refill request for Xarelto received.   Last office visit:12/08/2018 Weight: 78.1 kg Age: 69 yo Scr: 0.83, 12/08/2018 CrCl: 81.09 ml/min   Prescription refill sent.

## 2019-09-25 ENCOUNTER — Other Ambulatory Visit: Payer: Self-pay | Admitting: Interventional Cardiology

## 2019-09-25 NOTE — Telephone Encounter (Signed)
Pt last saw Dr Katrinka Blazing 12/08/18, last labs 12/08/18 Creat 0.83, age 69, weight 78.1kg, CrCl 79.98, based on CrCl pt is on appropriate dosage of Xarelto 20mg  QD.  Will refill rx.

## 2019-10-02 ENCOUNTER — Ambulatory Visit: Payer: Self-pay | Attending: Internal Medicine

## 2019-10-02 DIAGNOSIS — Z23 Encounter for immunization: Secondary | ICD-10-CM

## 2019-10-02 NOTE — Progress Notes (Signed)
   Covid-19 Vaccination Clinic  Name:  Yvonne Wheeler    MRN: [id number] DOB: 1950/10/25  10/02/2019  Yvonne Wheeler was observed post Covid-19 immunization for 15 minutes without incident. She was provided with Vaccine Information Sheet and instruction to access the V-Safe system.   Yvonne Wheeler was instructed to call 911 with any severe reactions post vaccine: Marland Kitchen Difficulty breathing  . Swelling of face and throat  . A fast heartbeat  . A bad rash all over body  . Dizziness and weakness

## 2019-12-01 ENCOUNTER — Other Ambulatory Visit: Payer: Self-pay | Admitting: Interventional Cardiology

## 2019-12-05 ENCOUNTER — Other Ambulatory Visit: Payer: Self-pay | Admitting: Family Medicine

## 2019-12-05 DIAGNOSIS — R413 Other amnesia: Secondary | ICD-10-CM

## 2019-12-11 ENCOUNTER — Other Ambulatory Visit: Payer: Self-pay | Admitting: Interventional Cardiology

## 2020-01-02 ENCOUNTER — Other Ambulatory Visit: Payer: Self-pay

## 2020-01-02 ENCOUNTER — Ambulatory Visit
Admission: RE | Admit: 2020-01-02 | Discharge: 2020-01-02 | Disposition: A | Discharge status: Home / Self Care | Payer: Medicare PPO | Source: Ambulatory Visit | Attending: Family Medicine | Admitting: Family Medicine

## 2020-01-02 DIAGNOSIS — R413 Other amnesia: Secondary | ICD-10-CM

## 2020-01-02 MED ORDER — GADOBENATE DIMEGLUMINE 529 MG/ML IV SOLN
17.0000 mL | Freq: Once | INTRAVENOUS | Status: AC | PRN
  Administered 2020-01-02: 17 mL via INTRAVENOUS

## 2020-01-09 NOTE — Progress Notes (Signed)
Cardiology Office Note:    Date:  01/10/2020   ID:  [name], DOB 09/07/1950, MRN [id number]  PCP:  Sigmund Hazel, MD  Cardiologist:  Lesleigh Noe, MD   Referring MD: Sigmund Hazel, MD   Chief Complaint  Patient presents with  . Atrial Fibrillation    History of Present Illness:    Yvonne Wheeler is a 69 y.o. female with a hx of paroxysmal atrial fibrillation, rhythm control on flecainide,and chronic anticoagulation therapy.    Past Medical History:  Diagnosis Date  . Atrial fibrillation (HCC) 10/18/2012   Cardioversion - successful. 10/17/12   . 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 Complication of anesthesia 2004   post op nausea/vomiting and difficult to arouse  . Dry eyes   . Dysrhythmia   . Pneumonia 10/02/2012    Past Surgical History:  Procedure Laterality Date  . CARDIOVERSION N/A 10/18/2012   Procedure: CARDIOVERSION;  Surgeon: Donato Schultz, MD;  Location: [facility] ENDOSCOPY;  Service: Cardiovascular;  Laterality: N/A;  . CARDIOVERSION N/A 12/07/2012   Procedure: CARDIOVERSION;  Surgeon: Lesleigh Noe, MD;  Location: [facility] ENDOSCOPY;  Service: Cardiovascular;  Laterality: N/A;  . LITHOTRIPSY    . MENISCUS REPAIR  08/2012  . TONSILLECTOMY      Current Medications: Current Meds  Medication Sig  . acetaminophen (TYLENOL) 500 MG tablet Take 1,000 mg by mouth every 6 (six) hours as needed for headache.   . cycloSPORINE (RESTASIS) 0.05 % ophthalmic emulsion Place 1 drop into both eyes every 12 (twelve) hours.  Marland Kitchen diltiazem (CARDIZEM CD) 240 MG 24 hr capsule TAKE 1 CAPSULE DAILY.  . flecainide (TAMBOCOR) 100 MG tablet TAKE 1 TABLET BY MOUTH TWICE DAILY.  Marland Kitchen latanoprost (XALATAN) 0.005 % ophthalmic solution Place 1 drop into both eyes at bedtime.  . Multiple Vitamin (MULITIVITAMIN WITH MINERALS) TABS Take 1 tablet by mouth daily.  . rosuvastatin (CRESTOR) 5 MG tablet TAKE 1 TABLET ONCE DAILY.  Marland Kitchen XARELTO 20 MG TABS tablet TAKE 1 TABLET BY MOUTH  DAILY.     Allergies:   Codeine and Sulfonamide derivatives   Social History   Socioeconomic History  . Marital status: Married    Spouse name: Not on file  . Number of children: Not on file  . Years of education: Not on file  . Highest education level: Not on file  Occupational History  . Not on file  Tobacco Use  . Smoking status: Never Smoker  . Smokeless tobacco: Never Used  Substance and Sexual Activity  . Alcohol use: Yes    Comment: occassional  . Drug use: No  . Sexual activity: Not on file  Other Topics Concern  . Not on file  Social History Narrative  . Not on file   Social Determinants of Health   Financial Resource Strain: Not on file  Food Insecurity: Not on file  Transportation Needs: Not on file  Physical Activity: Not on file  Stress: Not on file  Social Connections: Not on file     Family History: The patient's family history includes Heart disease in her father; Irregular heart beat in her mother.  ROS:   Please see the history of present illness.    Started about memory.  Had a recent MRI that did not reveal stroke or significant abnormality given her age.  All other systems reviewed and are negative.  EKGs/Labs/Other Studies Reviewed:    The following studies were reviewed today: MRI of the  brain January 02, 2020 IMPRESSION: No evidence of acute intracranial abnormality.  Mild cerebral atrophy which is not unexpected for patient age.  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  Otherwise unremarkable MRI appearance of the brain.  EKG:  EKG sinus rhythm, biatrial abnormality, but otherwise normal.  Recent Labs: 02/12/2019: ALT 15  Recent Lipid Panel    Component Value Date/Time   CHOL 154 02/12/2019 0928   TRIG 31 02/12/2019 0928   HDL 84 02/12/2019 0928   CHOLHDL 1.8 02/12/2019 0928   LDLCALC 62 02/12/2019 0928    Physical Exam:    VS:  BP 130/74   Pulse 67   Ht 5\' 6"   (1.676 m)   Wt 175 lb (79.4 kg)   SpO2 97%   BMI 28.25 kg/m     Wt Readings from Last 3 Encounters:  01/10/20 175 lb (79.4 kg)  12/08/18 172 lb 1.9 oz (78.1 kg)  08/31/17 172 lb (78 kg)     GEN: Overweight. No acute distress HEENT: Normal NECK: No JVD. LYMPHATICS: No lymphadenopathy CARDIAC:  RRR without murmur, gallop, or edema. VASCULAR:  Normal Pulses. No bruits. RESPIRATORY:  Clear to auscultation without rales, wheezing or rhonchi  ABDOMEN: Soft, non-tender, non-distended, No pulsatile mass, MUSCULOSKELETAL: No deformity  SKIN: Warm and dry NEUROLOGIC:  Alert and oriented x 3 PSYCHIATRIC:  Normal affect   ASSESSMENT:    1. Paroxysmal atrial fibrillation (HCC)   2. Hyperlipidemia, unspecified hyperlipidemia type   3. Long term current use of anticoagulant therapy   4. Encounter for therapeutic drug monitoring   5. Educated about COVID-19 virus infection    PLAN:    In order of problems listed above:  1. Rare episodes.  No quality of life issues on flecainide and Xarelto. 2. Continue statin therapy. 3. Continue Xarelto.  Monitor for bleeding.  Notify 09/02/17 if any. 4. Continue flecainide at current dose.  EKG is unrevealing for any QRS duration increase. 5. Vaccinated and practicing social distancing.   Medication Adjustments/Labs and Tests Ordered: Current medicines are reviewed at length with the patient today.  Concerns regarding medicines are outlined above.  Orders Placed This Encounter  Procedures  . EKG 12-Lead   No orders of the defined types were placed in this encounter.   There are no Patient Instructions on file for this visit.   Signed, [place], MD  01/10/2020 3:30 PM    [facility]

## 2020-01-10 ENCOUNTER — Ambulatory Visit: Payer: Medicare PPO | Admitting: Interventional Cardiology

## 2020-01-10 ENCOUNTER — Encounter: Payer: Self-pay | Admitting: Interventional Cardiology

## 2020-01-10 ENCOUNTER — Other Ambulatory Visit: Payer: Self-pay

## 2020-01-10 VITALS — BP 130/74 | HR 67 | Ht 66.0 in | Wt 175.0 lb

## 2020-01-10 DIAGNOSIS — Z5181 Encounter for therapeutic drug level monitoring: Secondary | ICD-10-CM | POA: Diagnosis not present

## 2020-01-10 DIAGNOSIS — Z7189 Other specified counseling: Secondary | ICD-10-CM

## 2020-01-10 DIAGNOSIS — Z7901 Long term (current) use of anticoagulants: Secondary | ICD-10-CM | POA: Diagnosis not present

## 2020-01-10 DIAGNOSIS — I480 Paroxysmal atrial fibrillation: Secondary | ICD-10-CM | POA: Diagnosis not present

## 2020-01-10 DIAGNOSIS — E785 Hyperlipidemia, unspecified: Secondary | ICD-10-CM | POA: Diagnosis not present

## 2020-01-10 NOTE — Patient Instructions (Signed)
Medication Instructions:  Your physician recommends that you continue on your current medications as directed. Please refer to the Current Medication list given to you today.  *If you need a refill on your cardiac medications before your next appointment, please call your pharmacy*   Lab Work: None If you have labs (blood work) drawn today and your tests are completely normal, you will receive your results only by: . MyChart Message (if you have MyChart) OR . A paper copy in the mail If you have any lab test that is abnormal or we need to change your treatment, we will call you to review the results.   Testing/Procedures: None   Follow-Up: At [facility], you and your health needs are our priority.  As part of our continuing mission to provide you with exceptional heart care, we have created designated Provider Care Teams.  These Care Teams include your primary Cardiologist (physician) and Advanced Practice Providers (APPs -  Physician Assistants and Nurse Practitioners) who all work together to provide you with the care you need, when you need it.  We recommend signing up for the patient portal called "MyChart".  Sign up information is provided on this After Visit Summary.  MyChart is used to connect with patients for Virtual Visits (Telemedicine).  Patients are able to view lab/test results, encounter notes, upcoming appointments, etc.  Non-urgent messages can be sent to your provider as well.   To learn more about what you can do with MyChart, go to [email or web address].    Your next appointment:   1 year(s)  The format for your next appointment:   In Person  Provider:   You may see Henry W Smith III, MD or one of the following Advanced Practice Providers on your designated Care Team:    Lori Gerhardt, NP  Laura Ingold, NP  Jill McDaniel, NP    Other Instructions   

## 2020-01-24 ENCOUNTER — Telehealth: Payer: Self-pay | Admitting: Interventional Cardiology

## 2020-01-24 NOTE — Telephone Encounter (Signed)
Okay to use.

## 2020-01-24 NOTE — Telephone Encounter (Signed)
Patient is calling about her medication. Please call to discuss

## 2020-01-24 NOTE — Telephone Encounter (Signed)
Pt calling today to verify with Dr Katrinka Blazing if it is OK for her to take Aricept 5 mg QHS.  This was ordered by her PCP.  Pt had read on paperwork from pharmacy that it can caused At Fib/increasing in abnormal rhythm.  Advised I will forward this to Dr Katrinka Blazing for his review when he returns.  Pt states understanding and will await a return phone call.

## 2020-01-24 NOTE — Telephone Encounter (Signed)
Pt is aware - ok to use per Dr Katrinka Blazing.  She thanked me for the call and information.

## 2020-02-03 ENCOUNTER — Other Ambulatory Visit: Payer: Self-pay | Admitting: Interventional Cardiology

## 2020-02-28 DIAGNOSIS — Z01419 Encounter for gynecological examination (general) (routine) without abnormal findings: Secondary | ICD-10-CM | POA: Diagnosis not present

## 2020-03-12 ENCOUNTER — Other Ambulatory Visit: Payer: Self-pay | Admitting: Interventional Cardiology

## 2020-03-19 ENCOUNTER — Other Ambulatory Visit: Payer: Self-pay | Admitting: Interventional Cardiology

## 2020-03-21 ENCOUNTER — Other Ambulatory Visit: Payer: Self-pay | Admitting: Interventional Cardiology

## 2020-03-21 NOTE — Telephone Encounter (Signed)
Xarelto 20mg  refill request received. Pt is 70 years old, weight-79.4kg, Crea-0.77 on 12/04/2019 via [facility] from [place], last seen by Dr. Natrona heights on 01/10/2020, Diagnosis-Afib, CrCl- 87.34ml/min; Dose is appropriate based on dosing criteria. Will send in refill to requested pharmacy.

## 2020-04-10 DIAGNOSIS — H40053 Ocular hypertension, bilateral: Secondary | ICD-10-CM | POA: Diagnosis not present

## 2020-04-10 DIAGNOSIS — H04123 Dry eye syndrome of bilateral lacrimal glands: Secondary | ICD-10-CM | POA: Diagnosis not present

## 2020-04-10 DIAGNOSIS — H524 Presbyopia: Secondary | ICD-10-CM | POA: Diagnosis not present

## 2020-04-10 DIAGNOSIS — H5212 Myopia, left eye: Secondary | ICD-10-CM | POA: Diagnosis not present

## 2020-04-10 DIAGNOSIS — H40049 Steroid responder, unspecified eye: Secondary | ICD-10-CM | POA: Diagnosis not present

## 2020-05-01 ENCOUNTER — Encounter: Payer: Self-pay | Admitting: Gastroenterology

## 2020-06-05 DIAGNOSIS — N3020 Other chronic cystitis without hematuria: Secondary | ICD-10-CM | POA: Diagnosis not present

## 2020-06-19 DIAGNOSIS — N3020 Other chronic cystitis without hematuria: Secondary | ICD-10-CM | POA: Diagnosis not present

## 2020-09-26 DIAGNOSIS — Z1231 Encounter for screening mammogram for malignant neoplasm of breast: Secondary | ICD-10-CM | POA: Diagnosis not present

## 2020-10-16 DIAGNOSIS — H40053 Ocular hypertension, bilateral: Secondary | ICD-10-CM | POA: Diagnosis not present

## 2020-12-08 DIAGNOSIS — N3020 Other chronic cystitis without hematuria: Secondary | ICD-10-CM | POA: Diagnosis not present

## 2020-12-19 DIAGNOSIS — Z23 Encounter for immunization: Secondary | ICD-10-CM | POA: Diagnosis not present

## 2020-12-19 DIAGNOSIS — I480 Paroxysmal atrial fibrillation: Secondary | ICD-10-CM | POA: Diagnosis not present

## 2020-12-19 DIAGNOSIS — Z0000 Encounter for general adult medical examination without abnormal findings: Secondary | ICD-10-CM | POA: Diagnosis not present

## 2020-12-19 DIAGNOSIS — R413 Other amnesia: Secondary | ICD-10-CM | POA: Diagnosis not present

## 2020-12-19 DIAGNOSIS — M8580 Other specified disorders of bone density and structure, unspecified site: Secondary | ICD-10-CM | POA: Diagnosis not present

## 2020-12-19 DIAGNOSIS — E559 Vitamin D deficiency, unspecified: Secondary | ICD-10-CM | POA: Diagnosis not present

## 2020-12-19 DIAGNOSIS — R296 Repeated falls: Secondary | ICD-10-CM | POA: Diagnosis not present

## 2020-12-19 DIAGNOSIS — E7800 Pure hypercholesterolemia, unspecified: Secondary | ICD-10-CM | POA: Diagnosis not present

## 2020-12-19 DIAGNOSIS — Z86718 Personal history of other venous thrombosis and embolism: Secondary | ICD-10-CM | POA: Diagnosis not present

## 2020-12-19 DIAGNOSIS — M859 Disorder of bone density and structure, unspecified: Secondary | ICD-10-CM | POA: Diagnosis not present

## 2020-12-31 ENCOUNTER — Other Ambulatory Visit: Payer: Self-pay | Admitting: Interventional Cardiology

## 2020-12-31 NOTE — Telephone Encounter (Signed)
Pt last saw Dr Katrinka Blazing 01/10/20, pt has upcoming appt 04/23/21 scheduled to f/u with Dr Katrinka Blazing. Last labs 12/19/20 Creat 0.87 at [facility] per [facility], age 70, weight 79.4kg, CrCl 76.5, based on CrCl pt is on appropriate dosage of Xarelto 20mg  QD for afib.  Will refill rx.

## 2021-02-05 ENCOUNTER — Other Ambulatory Visit: Payer: Self-pay | Admitting: Interventional Cardiology

## 2021-02-11 DIAGNOSIS — R059 Cough, unspecified: Secondary | ICD-10-CM | POA: Diagnosis not present

## 2021-03-16 ENCOUNTER — Other Ambulatory Visit: Payer: Self-pay | Admitting: Interventional Cardiology

## 2021-03-16 DIAGNOSIS — Z01419 Encounter for gynecological examination (general) (routine) without abnormal findings: Secondary | ICD-10-CM | POA: Diagnosis not present

## 2021-04-03 ENCOUNTER — Other Ambulatory Visit: Payer: Self-pay | Admitting: Interventional Cardiology

## 2021-04-17 ENCOUNTER — Other Ambulatory Visit: Payer: Self-pay | Admitting: Interventional Cardiology

## 2021-04-21 NOTE — Progress Notes (Signed)
?Cardiology Office Note:   ? ?Date:  04/23/2021  ? ?ID:  Yvonne FiscalCarol R Barren, DOB 01/03/1951, MRN [id number] ? ?PCP:  Sigmund HazelMiller, [name], MD  ?Cardiologist:  Lesleigh NoeHenry W Jerman Tinnon III, MD  ? ?Referring MD: Sigmund HazelMiller, [name], MD  ? ?No chief complaint on file. ? ? ?History of Present Illness:   ? ?Yvonne Wheeler is a 71 y.o. female with a hx of  paroxysmal atrial fibrillation, rhythm control on flecainide, and chronic anticoagulation therapy. ? ? ?Is in atrial fibrillation this morning according to the patient.  When EKG was done it confirmed her opinion.  She has atrial fibrillation.  Just took flecainide about 1 hour ago.  She feels fine.  No exertional intolerance.  Rate is not excessively fast.  She has no particular requests and feels this is typical.  She may have this once per month.  It is usually short-lived. ? ?No bleeding, medication side effects, syncope, dyspnea, or other complaints. ? ?Past Medical History:  ?Diagnosis Date  ? Atrial fibrillation (HCC) 10/18/2012  ? Cardioversion - successful. 10/17/12   ? 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 Complication of anesthesia 2004  ? post op nausea/vomiting and difficult to arouse  ? Dry eyes   ? Dysrhythmia   ? Pneumonia 10/02/2012  ? ? ?Past Surgical History:  ?Procedure Laterality Date  ? CARDIOVERSION N/A 10/18/2012  ? Procedure: CARDIOVERSION;  Surgeon: Donato SchultzMark Skains, MD;  [facility];  Service: Cardiovascular;  Laterality: N/A;  ? CARDIOVERSION N/A 12/07/2012  ? Procedure: CARDIOVERSION;  Surgeon: Lesleigh NoeHenry W Dynver Clemson III, MD;  [facility];  Service: Cardiovascular;  Laterality: N/A;  ? LITHOTRIPSY    ? MENISCUS REPAIR  08/2012  ? TONSILLECTOMY    ? ? ?Current Medications: ?Current Meds  ?Medication Sig  ? acetaminophen (TYLENOL) 500 MG tablet Take 1,000 mg by mouth every 6 (six) hours as needed for headache.   ? cycloSPORINE (RESTASIS) 0.05 % ophthalmic emulsion Place 1 drop into both eyes every 12 (twelve) hours.  ? diltiazem (CARDIZEM CD) 240 MG 24  hr capsule TAKE ONE CAPSULE DAILY **NEED OFFICE VISIT**  ? flecainide (TAMBOCOR) 100 MG tablet TAKE ONE TABLET BY MOUTH TWICE DAILY  ? latanoprost (XALATAN) 0.005 % ophthalmic solution Place 1 drop into both eyes at bedtime.  ? Multiple Vitamin (MULITIVITAMIN WITH MINERALS) TABS Take 1 tablet by mouth daily.  ? rivaroxaban (XARELTO) 20 MG TABS tablet TAKE ONE TABLET BY MOUTH ONCE DAILY.  ? rosuvastatin (CRESTOR) 5 MG tablet Take 1 tablet (5 mg total) by mouth daily. Please keep upcoming appt with Dr. Katrinka BlazingSmith in March 2023 before anymore refills. Thank you Final Attempt  ?  ? ?Allergies:   Codeine and Sulfonamide derivatives  ? ?Social History  ? ?Socioeconomic History  ? Marital status: Married  ?  Spouse name: Not on file  ? Number of children: Not on file  ? Years of education: Not on file  ? Highest education level: Not on file  ?Occupational History  ? Not on file  ?Tobacco Use  ? Smoking status: Never  ? Smokeless tobacco: Never  ?Substance and Sexual Activity  ? Alcohol use: Yes  ?  Comment: occassional  ? Drug use: No  ? Sexual activity: Not on file  ?Other Topics Concern  ? Not on file  ?Social History Narrative  ? Not on file  ? ?Social Determinants of Health  ? ?Financial Resource Strain: Not on file  ?Food Insecurity: Not on file  ?  Transportation Needs: Not on file  ?Physical Activity: Not on file  ?Stress: Not on file  ?Social Connections: Not on file  ?  ? ?Family History: ?The patient's family history includes Heart disease in her father; Irregular heart beat in her mother. ? ?ROS:   ?Please see the history of present illness.    ?On last visit was concerned about memory.  No particular issues or discussed otherwise.  Memory is not an issue currently.  All other systems reviewed and are negative. ? ?EKGs/Labs/Other Studies Reviewed:   ? ?The following studies were reviewed today: ?No new or recent imaging or monitoring. ? ?EKG:  EKG atrial fibrillation with controlled ventricular response at 83 bpm.   When compared to the prior tracing of December 2021, A-fib is new. ? ?Recent Labs: ?No results found for requested labs within last 8760 hours.  ?Recent Lipid Panel ?   ?Component Value Date/Time  ? CHOL 154 02/12/2019 0928  ? TRIG 31 02/12/2019 0928  ? HDL 84 02/12/2019 0928  ? CHOLHDL 1.8 02/12/2019 0928  ? LDLCALC 62 02/12/2019 0928  ? ? ?Physical Exam:   ? ?VS:  BP 126/68   Pulse 83   Ht 5\' 6"  (1.676 m)   Wt 177 lb (80.3 kg)   SpO2 98%   BMI 28.57 kg/m?    ? ?Wt Readings from Last 3 Encounters:  ?04/23/21 177 lb (80.3 kg)  ?01/10/20 175 lb (79.4 kg)  ?12/08/18 172 lb 1.9 oz (78.1 kg)  ?  ? ?GEN: Slightly overweight. No acute distress ?HEENT: Normal ?NECK: No JVD. ?LYMPHATICS: No lymphadenopathy ?CARDIAC: No murmur. IiRR no gallop, or edema. ?VASCULAR:  Normal Pulses. No bruits. ?RESPIRATORY:  Clear to auscultation without rales, wheezing or rhonchi  ?ABDOMEN: Soft, non-tender, non-distended, No pulsatile mass, ?MUSCULOSKELETAL: No deformity  ?SKIN: Warm and dry ?NEUROLOGIC:  Alert and oriented x 3 ?PSYCHIATRIC:  Normal affect  ? ?ASSESSMENT:   ? ?1. Paroxysmal atrial fibrillation (HCC)   ?2. Hyperlipidemia, unspecified hyperlipidemia type   ?3. Long term current use of anticoagulant therapy   ?4. Encounter for monitoring flecainide therapy   ? ?PLAN:   ? ?In order of problems listed above: ? ?Currently in A-fib with controlled rate.  This is uncommon occurrence.  She will notify 13/06/20 if it persists.  No change in game plan.  Continue medical therapy with flecainide twice daily and stroke prevention using Xarelto. ?Continue rosuvastatin 5 mg/day.  Last LDL cholesterol was 88 in November. ?Continue Xarelto.  Last hemoglobin 14.9 in November. ?No clinical evidence of flecainide toxicity.  Most recent potassium level in November was 4.7. ? ?The natural history of atrial fibrillation was discussed.  The inability to cure and the possibility of recurrences was clearly stated.  Management strategies including rhythm  control with antiarrhythmic therapy and/or ablation,  rate control (beta-blocker therapy, digoxin, or AV node blocking CCB therapy), and  occasional need for pacemaker therapy were reviewed.  Stroke risk (as determined by CHADS VASC score >1). The requirement for and the duration/permanence of anti-coagulation therapy will be determined by context of individual risk factors and burden of AF.  Anticoagulation risks (vitamin K antagonists v DOAC therapy) were reviewed, highlighting the lower bleeding ris@10RELATIVEDAYS @ k and improved safety with DOAC's. Shared decision making was stressed. ? ? ? ?Medication Adjustments/Labs and Tests Ordered: ?Current medicines are reviewed at length with the patient today.  Concerns regarding medicines are outlined above.  ?Orders Placed This Encounter  ?Procedures  ? EKG 12-Lead  ? ?  No orders of the defined types were placed in this encounter. ? ? ?Patient Instructions  ?Medication Instructions:  ?Your physician recommends that you continue on your current medications as directed. Please refer to the Current Medication list given to you today. ? ?*If you need a refill on your cardiac medications before your next appointment, please call your pharmacy* ? ? ?Lab Work: ?None ?If you have labs (blood work) drawn today and your tests are completely normal, you will receive your results only by: ?MyChart Message (if you have MyChart) OR ?A paper copy in the mail ?If you have any lab test that is abnormal or we need to change your treatment, we will call you to review the results. ? ? ?Testing/Procedures: ?None ? ? ?Follow-Up: ?At [facility], you and your health needs are our priority.  As part of our continuing mission to provide you with exceptional heart care, we have created designated Provider Care Teams.  These Care Teams include your primary Cardiologist (physician) and Advanced Practice Providers (APPs -  Physician Assistants and Nurse Practitioners) who all work together to  provide you with the care you need, when you need it. ? ?We recommend signing up for the patient portal called "MyChart".  Sign up information is provided on this After Visit Summary.  MyChart is used to [employer]

## 2021-04-23 ENCOUNTER — Encounter: Payer: Self-pay | Admitting: Interventional Cardiology

## 2021-04-23 ENCOUNTER — Other Ambulatory Visit: Payer: Self-pay

## 2021-04-23 ENCOUNTER — Ambulatory Visit: Payer: Medicare PPO | Admitting: Interventional Cardiology

## 2021-04-23 VITALS — BP 126/68 | HR 83 | Ht 66.0 in | Wt 177.0 lb

## 2021-04-23 DIAGNOSIS — Z5181 Encounter for therapeutic drug level monitoring: Secondary | ICD-10-CM

## 2021-04-23 DIAGNOSIS — Z7901 Long term (current) use of anticoagulants: Secondary | ICD-10-CM

## 2021-04-23 DIAGNOSIS — I480 Paroxysmal atrial fibrillation: Secondary | ICD-10-CM

## 2021-04-23 DIAGNOSIS — Z79899 Other long term (current) drug therapy: Secondary | ICD-10-CM

## 2021-04-23 DIAGNOSIS — E785 Hyperlipidemia, unspecified: Secondary | ICD-10-CM

## 2021-04-23 NOTE — Patient Instructions (Signed)

## 2021-05-04 ENCOUNTER — Other Ambulatory Visit: Payer: Self-pay | Admitting: Interventional Cardiology

## 2021-05-05 DIAGNOSIS — H40053 Ocular hypertension, bilateral: Secondary | ICD-10-CM | POA: Diagnosis not present

## 2021-05-05 DIAGNOSIS — H2513 Age-related nuclear cataract, bilateral: Secondary | ICD-10-CM | POA: Diagnosis not present

## 2021-05-05 DIAGNOSIS — H40049 Steroid responder, unspecified eye: Secondary | ICD-10-CM | POA: Diagnosis not present

## 2021-05-05 DIAGNOSIS — H04123 Dry eye syndrome of bilateral lacrimal glands: Secondary | ICD-10-CM | POA: Diagnosis not present

## 2021-05-18 ENCOUNTER — Other Ambulatory Visit: Payer: Self-pay | Admitting: Interventional Cardiology

## 2021-07-07 ENCOUNTER — Other Ambulatory Visit: Payer: Self-pay

## 2021-07-07 ENCOUNTER — Other Ambulatory Visit: Payer: Self-pay | Admitting: Interventional Cardiology

## 2021-07-07 NOTE — Telephone Encounter (Signed)
Prescription refill request for Xarelto received.  Indication: Afib  Last office visit: 04/23/21 Tamala Julian)  Weight: 80.3kg Age: 71 Scr: 0.87 (12/19/20)  CrCl: 77.21ml/min  Appropriate dose and refill sent to requested pharmacy.

## 2021-10-09 DIAGNOSIS — Z1231 Encounter for screening mammogram for malignant neoplasm of breast: Secondary | ICD-10-CM | POA: Diagnosis not present

## 2021-10-29 DIAGNOSIS — M8589 Other specified disorders of bone density and structure, multiple sites: Secondary | ICD-10-CM | POA: Diagnosis not present

## 2021-10-29 DIAGNOSIS — Z780 Asymptomatic menopausal state: Secondary | ICD-10-CM | POA: Diagnosis not present

## 2021-11-24 DIAGNOSIS — H40053 Ocular hypertension, bilateral: Secondary | ICD-10-CM | POA: Diagnosis not present

## 2021-11-24 DIAGNOSIS — H04123 Dry eye syndrome of bilateral lacrimal glands: Secondary | ICD-10-CM | POA: Diagnosis not present

## 2021-11-24 DIAGNOSIS — H2513 Age-related nuclear cataract, bilateral: Secondary | ICD-10-CM | POA: Diagnosis not present

## 2021-11-24 DIAGNOSIS — H40049 Steroid responder, unspecified eye: Secondary | ICD-10-CM | POA: Diagnosis not present

## 2021-11-26 DIAGNOSIS — N3020 Other chronic cystitis without hematuria: Secondary | ICD-10-CM | POA: Diagnosis not present

## 2021-11-26 DIAGNOSIS — R300 Dysuria: Secondary | ICD-10-CM | POA: Diagnosis not present

## 2021-12-16 DIAGNOSIS — N3020 Other chronic cystitis without hematuria: Secondary | ICD-10-CM | POA: Diagnosis not present

## 2022-01-01 DIAGNOSIS — E7800 Pure hypercholesterolemia, unspecified: Secondary | ICD-10-CM | POA: Diagnosis not present

## 2022-01-01 DIAGNOSIS — Z0000 Encounter for general adult medical examination without abnormal findings: Secondary | ICD-10-CM | POA: Diagnosis not present

## 2022-01-01 DIAGNOSIS — Z6829 Body mass index (BMI) 29.0-29.9, adult: Secondary | ICD-10-CM | POA: Diagnosis not present

## 2022-01-01 DIAGNOSIS — M8580 Other specified disorders of bone density and structure, unspecified site: Secondary | ICD-10-CM | POA: Diagnosis not present

## 2022-01-01 DIAGNOSIS — M79652 Pain in left thigh: Secondary | ICD-10-CM | POA: Diagnosis not present

## 2022-01-01 DIAGNOSIS — D6869 Other thrombophilia: Secondary | ICD-10-CM | POA: Diagnosis not present

## 2022-01-01 DIAGNOSIS — F03A0 Unspecified dementia, mild, without behavioral disturbance, psychotic disturbance, mood disturbance, and anxiety: Secondary | ICD-10-CM | POA: Diagnosis not present

## 2022-01-01 DIAGNOSIS — I480 Paroxysmal atrial fibrillation: Secondary | ICD-10-CM | POA: Diagnosis not present

## 2022-01-16 ENCOUNTER — Other Ambulatory Visit: Payer: Self-pay | Admitting: Interventional Cardiology

## 2022-01-18 NOTE — Telephone Encounter (Signed)
Prescription refill request for Xarelto received.  Indication:afib Last office visit:3/23 Weight:80.3 kg Age:71 Scr:0.8 CrCl:82.95  ml/min  Prescription refilled

## 2022-05-03 DIAGNOSIS — R300 Dysuria: Secondary | ICD-10-CM | POA: Diagnosis not present

## 2022-05-14 ENCOUNTER — Other Ambulatory Visit: Payer: Self-pay | Admitting: Cardiology

## 2022-05-24 ENCOUNTER — Other Ambulatory Visit: Payer: Self-pay

## 2022-05-24 MED ORDER — DILTIAZEM HCL ER COATED BEADS 240 MG PO CP24: ORAL_CAPSULE | ORAL | 0 refills | Status: DC

## 2022-06-09 ENCOUNTER — Ambulatory Visit: Payer: Medicare PPO | Attending: Cardiology | Admitting: Cardiology

## 2022-06-09 ENCOUNTER — Encounter: Payer: Self-pay | Admitting: Cardiology

## 2022-06-09 VITALS — BP 142/64 | HR 81 | Ht 65.0 in | Wt 180.2 lb

## 2022-06-09 DIAGNOSIS — Z7901 Long term (current) use of anticoagulants: Secondary | ICD-10-CM | POA: Diagnosis not present

## 2022-06-09 DIAGNOSIS — I480 Paroxysmal atrial fibrillation: Secondary | ICD-10-CM

## 2022-06-09 DIAGNOSIS — I10 Essential (primary) hypertension: Secondary | ICD-10-CM | POA: Diagnosis not present

## 2022-06-09 DIAGNOSIS — Z79899 Other long term (current) drug therapy: Secondary | ICD-10-CM | POA: Diagnosis not present

## 2022-06-09 DIAGNOSIS — Z5181 Encounter for therapeutic drug level monitoring: Secondary | ICD-10-CM | POA: Diagnosis not present

## 2022-06-09 DIAGNOSIS — E785 Hyperlipidemia, unspecified: Secondary | ICD-10-CM | POA: Diagnosis not present

## 2022-06-09 NOTE — Assessment & Plan Note (Signed)
     The natural history of atrial fibrillation was discussed. The inability to cure and the possibility of recurrences was clearly stated. Management strategies including rhythm control with antiarrhythmic therapy and/or ablation, rate control (beta-blocker therapy, digoxin, or AV node blocking CCB therapy), and occasional need for pacemaker therapy were reviewed. Stroke risk (as determined by CHADS VASC score >1). The requirement for and the duration/permanence of anti-coagulation therapy will be determined by context of individual risk factors and burden of AF. Anticoagulation risks (vitamin K antagonists v DOAC therapy) were reviewed, highlighting the lower bleeding risk and improved safety with DOAC's. Shared decision making was stressed.

## 2022-06-09 NOTE — Progress Notes (Unsigned)
Primary Care Provider: Sigmund Hazel, MD [facility] Cardiologist: Lesleigh Noe, MD (Inactive) Electrophysiologist: None  Clinic Note: No chief complaint on file.  ===================================  ASSESSMENT/PLAN   Problem List Items Addressed This Visit       Cardiology Problems   Atrial fibrillation [facility]) (Chronic)        The natural history of atrial fibrillation was discussed. The inability to cure and the possibility of recurrences was clearly stated. Management strategies including rhythm control with antiarrhythmic therapy and/or ablation, rate control (beta-blocker therapy, digoxin, or AV node blocking CCB therapy), and occasional need for pacemaker therapy were reviewed. Stroke risk (as determined by CHADS VASC score >1). The requirement for and the duration/permanence of anti-coagulation therapy will be determined by context of individual risk factors and burden of AF. Anticoagulation risks (vitamin K antagonists v DOAC therapy) were reviewed, highlighting the lower bleeding risk and improved safety with DOAC's. Shared decision making was stressed.          Other   Long term current use of anticoagulant therapy - Primary (Chronic)   ===================================  HPI:    Yvonne Wheeler is a 72 y.o. female with history of PAF (rhythm control with flecainide & diltiazem; Xarelto), and HLD who is being seen today for annual follow-up at the request of Sigmund Hazel, MD.  Yvonne Wheeler was last seen by Dr. Katrinka Blazing in March 2023.  At that time she is actually was in A-fib which seem to be unusual.  The plan was to continue with flecainide twice daily along with diltiazem for rate control and Xarelto.  Was on low-dose Crestor.  Recent Hospitalizations: none  Reviewed  CV studies:    The following studies were reviewed today: (if available, images/films reviewed: From Epic Chart or Care Everywhere) ***:   Interval History:   Yvonne Wheeler    CV Review of Symptoms (Summary): {roscv:310661}  REVIEWED OF SYSTEMS   ROS  I have reviewed and (if needed) personally updated the patient's problem list, medications, allergies, past medical and surgical history, social and family history.   PAST MEDICAL HISTORY   Past Medical History:  Diagnosis Date   Atrial fibrillation (HCC) 10/18/2012   Cardioversion - successful. 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   Complication of anesthesia 2004   post op nausea/vomiting and difficult to arouse   Dry eyes    Dysrhythmia    Pneumonia 10/02/2012    PAST SURGICAL HISTORY   Past Surgical History:  Procedure Laterality Date   CARDIOVERSION N/A 10/18/2012   Procedure: CARDIOVERSION;  Surgeon: Donato Schultz, MD;  Location: [facility] ENDOSCOPY;  Service: Cardiovascular;  Laterality: N/A;   CARDIOVERSION N/A 12/07/2012   Procedure: CARDIOVERSION;  Surgeon: Lesleigh Noe, MD;  Location: [facility] ENDOSCOPY;  Service: Cardiovascular;  Laterality: N/A;   LITHOTRIPSY     MENISCUS REPAIR  08/2012   TONSILLECTOMY      MEDICATIONS/ALLERGIES   Current Meds  Medication Sig   acetaminophen (TYLENOL) 500 MG tablet Take 1,000 mg by mouth every 6 (six) hours as needed for headache.    cycloSPORINE (RESTASIS) 0.05 % ophthalmic emulsion Place 1 drop into both eyes every 12 (twelve) hours.   flecainide (TAMBOCOR) 100 MG tablet TAKE ONE TABLET BY MOUTH TWICE DAILY   latanoprost (XALATAN) 0.005 % ophthalmic solution Place 1 drop into both eyes at bedtime.   Multiple Vitamin (MULITIVITAMIN WITH MINERALS) TABS Take 1 tablet by mouth daily.  rosuvastatin (CRESTOR) 5 MG tablet TAKE ONE TABLET BY MOUTH DAILY   XARELTO 20 MG TABS tablet TAKE ONE TABLET BY MOUTH ONCE DAILY    Allergies  Allergen Reactions   Codeine Nausea And Vomiting   Sulfonamide Derivatives Hives    SOCIAL HISTORY/FAMILY HISTORY   Reviewed in Epic:   Social History   Tobacco Use   Smoking status: Never    Smokeless tobacco: Never  Substance Use Topics   Alcohol use: Yes    Comment: occassional   Drug use: No   Social History   Social History Narrative   Not on file   Family History  Problem Relation Age of Onset   Irregular heart beat Mother    Heart disease Father     OBJCTIVE -PE, EKG, labs   Wt Readings from Last 3 Encounters:  06/09/22 180 lb 3.2 oz (81.7 kg)  04/23/21 177 lb (80.3 kg)  01/10/20 175 lb (79.4 kg)    Physical Exam: BP (!) 142/64   Pulse 81   Ht 5\' 5"  (1.651 m)   Wt 180 lb 3.2 oz (81.7 kg)   BMI 29.99 kg/m  Physical Exam Vitals reviewed.  Constitutional:      General: She is not in acute distress.    Appearance: Normal appearance. She is not ill-appearing or toxic-appearing.  HENT:     Head: Normocephalic and atraumatic.  Neck:     Vascular: No carotid bruit.  Cardiovascular:     Rate and Rhythm: Normal rate and regular rhythm.     Pulses: Normal pulses.     Heart sounds: Normal heart sounds. No murmur heard.    No friction rub. No gallop.  Pulmonary:     Effort: Pulmonary effort is normal. No respiratory distress.     Breath sounds: Normal breath sounds. No wheezing, rhonchi or rales.  Musculoskeletal:        General: No swelling.     Cervical back: Normal range of motion and neck supple.  Skin:    General: Skin is warm and dry.  Neurological:     General: No focal deficit present.     Mental Status: She is alert and oriented to person, place, and time.     Gait: Gait normal.  Psychiatric:        Mood and Affect: Mood normal.        Behavior: Behavior normal.        Thought Content: Thought content normal.        Judgment: Judgment normal.      Adult ECG Report  Rate: *** ;  Rhythm: {rhythm:17366};   Narrative Interpretation: ***  Recent Labs:   01/01/2022:   Lab Results  Component Value Date   CHOL 154 02/12/2019   HDL 84 02/12/2019   LDLCALC 62 02/12/2019   TRIG 31 02/12/2019   CHOLHDL 1.8 02/12/2019   Lab Results   Component Value Date   CREATININE 0.83 12/08/2018   BUN 13 12/08/2018   NA 142 12/08/2018   K 4.3 12/08/2018   CL 105 12/08/2018   CO2 21 12/08/2018      Latest Ref Rng & Units 12/08/2018    8:58 AM 08/31/2017    9:05 AM 07/01/2016   12:54 PM  CBC  WBC 3.4 - 10.8 x10E3/uL 3.9  3.4  4.4   Hemoglobin 11.1 - 15.9 g/dL 16.1  09.6  04.5   Hematocrit 34.0 - 46.6 % 41.7  43.7  43.1   Platelets 150 -  450 x10E3/uL 212  207  203     No results found for: "HGBA1C" No results found for: "TSH"  ================================================== I spent a total of *** minutes with the patient spent in direct patient consultation.  Additional time spent with chart review  / charting (studies, outside notes, etc): *** min Total Time: *** min  Current medicines are reviewed at length with the patient today.  (+/- concerns) ***  Notice: This dictation was prepared with Dragon dictation along with smart phrase technology. Any transcriptional errors that result from this process are unintentional and may not be corrected upon review.   Studies Ordered:  No orders of the defined types were placed in this encounter.  No orders of the defined types were placed in this encounter.   Patient Instructions / Medication Changes & Studies & Tests Ordered   There are no Patient Instructions on file for this visit.    Marykay Lex, MD, MS Bryan Lemma, M.D., M.S. Interventional Cardiologist  [facility]  Pager # [phone] Phone # [phone] [address] [place], [place] [address]   Thank you for choosing [facility]!!

## 2022-06-09 NOTE — Patient Instructions (Addendum)
Medication Instructions:   No changes    Do a statin Holiday  ( meaning do not take Rosuvastatin   for 4 to 6 weeks  to see if your memory improves - if you notice a change please contact office back   *If you need a refill on your cardiac medications before your next appointment, please call your pharmacy*   Lab Work:  Not needed   Testing/Procedures:  Not needed  Follow-Up: At [facility], you and your health needs are our priority.  As part of our continuing mission to provide you with exceptional heart care, we have created designated Provider Care Teams.  These Care Teams include your primary Cardiologist (physician) and Advanced Practice Providers (APPs -  Physician Assistants and Nurse Practitioners) who all work together to provide you with the care you need, when you need it.     Your next appointment:   10 month(s) March 2025  The format for your next appointment:   In Person  Provider:   Bryan Lemma, MD

## 2022-06-10 ENCOUNTER — Telehealth: Payer: Self-pay | Admitting: Cardiology

## 2022-06-10 ENCOUNTER — Encounter: Payer: Self-pay | Admitting: Cardiology

## 2022-06-10 DIAGNOSIS — I10 Essential (primary) hypertension: Secondary | ICD-10-CM | POA: Insufficient documentation

## 2022-06-10 DIAGNOSIS — E785 Hyperlipidemia, unspecified: Secondary | ICD-10-CM | POA: Insufficient documentation

## 2022-06-10 DIAGNOSIS — I480 Paroxysmal atrial fibrillation: Secondary | ICD-10-CM

## 2022-06-10 NOTE — Telephone Encounter (Signed)
Spoke with patient and she stated when she got home she looked at her medication list and her diltiazem 240mg  was not on there. Looking at her current medication list its not on current list or her AVS. I do see it in her inactive list. She would like to know is she still supposed to be taking diltiazem. If so she will need a new script sent to her pharmacy. She has a week left of medicaion.

## 2022-06-10 NOTE — Assessment & Plan Note (Signed)
This patients CHA2DS2-VASc Score and unadjusted Ischemic Stroke Rate (% per year) is equal to 3.2 % stroke rate/year from a score of 3  Above score calculated as 1 point each if present [CHF, HTN, DM, Vascular=MI/PAD/Aortic Plaque, Age if 65-74, or Female] Above score calculated as 2 points each if present [Age > 75, or Stroke/TIA/TE]  On Xarelto 20 mg daily.  No bleeding issues. => Okay to hold Xarelto PRN for procedures-no need to bridge. For simple minor procedures can actually just hold for 24 hours, but for most minor procedures would hold 48 hours. For more complex/high risk procedure such as organ biopsies, neuro procedures would hold 72 hours preop. Restart when safe postop

## 2022-06-10 NOTE — Assessment & Plan Note (Signed)
BP is just mildly elevated today but for her to this is a relatively high blood pressure compared to what she gets at home.  As such I would not want to be much more aggressive than the already are with the diltiazem.  Will continue to monitor.

## 2022-06-10 NOTE — Assessment & Plan Note (Signed)
LDL is 94 on low-dose Crestor.  She noted having some memory issues and is going to have an evaluation for that, but I am concerned that maybe there could be a statin component.  Recommended 4 to 6-week statin holiday to reassess for change in memory.  If she does notice an improvement, I would probably consider Livalo as an option versus Nexlizet.

## 2022-06-10 NOTE — Assessment & Plan Note (Signed)
On long standing dose of flecainide.  Discussed importance of at least annual if not twice annual review of electrolytes.  We discussed chronotropic competence.  No arrhythmia symptoms.  Is on baseline diltiazem as well.

## 2022-06-10 NOTE — Telephone Encounter (Signed)
My understanding was that there was no intention for her to have this medicine stopped.  I think we can probably just put her back on long-acting diltiazem 120 mg daily for now.  We just wanted to follow her blood pressures to ensure that they are doing okay.  I do not like the idea of her being on flecainide without a beta-blocker or calcium channel blocker.  Rx: Diltiazem XT (long-acting) 120 mg daily.  Bryan Lemma, MD

## 2022-06-10 NOTE — Telephone Encounter (Signed)
Pt c/o medication issue:  1. Name of Medication: Carvedilol 240 mg 24 hour capsule   2. How are you currently taking this medication (dosage and times per day)? 1 tablet daily   3. Are you having a reaction (difficulty breathing--STAT)? No   4. What is your medication issue? Patient is actively taking medication, but it was not listed on her AVS from her 05/08 appointment. Patient is needing a refill soon, so she would like a callback to get this sorted out. Please advise.

## 2022-06-12 NOTE — Telephone Encounter (Signed)
Based on her last visit with Dr. Katrinka Blazing - med list indicated that she was on Flecainide 100 mg BID & Diltiazem XT (or CD) 240 mg daily. -- however, when I  saw her - the Diltiazem was no longer listed.  I asked her to confirm if she  was taking the Diltiazem or not.   The original message indicated that she was not taking Diltiazem -& it wan't on her list ?   Is she actually taking the 240 mg dose?  If so - then she should continue at that dose.   If she has not been taking it for some time - then I would want to restart the medication -  but at a lower dose to avoid low BP.  Bryan Lemma, MD

## 2022-06-15 DIAGNOSIS — N3021 Other chronic cystitis with hematuria: Secondary | ICD-10-CM | POA: Diagnosis not present

## 2022-06-15 DIAGNOSIS — S8001XA Contusion of right knee, initial encounter: Secondary | ICD-10-CM | POA: Diagnosis not present

## 2022-06-15 MED ORDER — DILTIAZEM HCL ER COATED BEADS 240 MG PO CP24
240.0000 mg | ORAL_CAPSULE | Freq: Every day | ORAL | 3 refills | Status: DC
Start: 2022-06-15 — End: 2023-07-12

## 2022-06-15 NOTE — Addendum Note (Signed)
Addended by: Judene Companion on: 06/15/2022 08:24 AM   Modules accepted: Orders

## 2022-06-15 NOTE — Telephone Encounter (Signed)
Confirmed with patient and she "states she has not stop taking her Diltiazem 240mg " she only called to make sure she needed to continue taking the medication because it was not on her medication list printed out at office. Refill sent for Diltiazem 240 mg sent to pharmacy.

## 2022-07-12 ENCOUNTER — Other Ambulatory Visit: Payer: Self-pay

## 2022-07-12 ENCOUNTER — Other Ambulatory Visit: Payer: Self-pay | Admitting: *Deleted

## 2022-07-12 DIAGNOSIS — I480 Paroxysmal atrial fibrillation: Secondary | ICD-10-CM

## 2022-07-12 MED ORDER — RIVAROXABAN 20 MG PO TABS
20.0000 mg | ORAL_TABLET | Freq: Every day | ORAL | 5 refills | Status: DC
Start: 2022-07-12 — End: 2023-01-07

## 2022-07-12 MED ORDER — FLECAINIDE ACETATE 100 MG PO TABS: 100.0000 mg | ORAL_TABLET | Freq: Two times a day (BID) | ORAL | 3 refills | Status: DC

## 2022-07-12 NOTE — Addendum Note (Signed)
Addended by: Pamala Hurry B on: 07/12/2022 08:45 AM   Modules accepted: Orders

## 2022-07-12 NOTE — Telephone Encounter (Signed)
Xarelto 20mg  refill request received. Pt is 72 years old, weight-81.7kg, Crea-0.70 on 06/15/22 via [facility] from [facility], last seen by Dr. Herbie Baltimore on 06/09/22, Diagnosis-Afib, CrCl-96.45 mL/min; Dose is appropriate based on dosing criteria. Will send in refill to requested pharmacy.

## 2022-07-13 DIAGNOSIS — N3000 Acute cystitis without hematuria: Secondary | ICD-10-CM | POA: Diagnosis not present

## 2022-08-23 DIAGNOSIS — I480 Paroxysmal atrial fibrillation: Secondary | ICD-10-CM | POA: Diagnosis not present

## 2022-08-23 DIAGNOSIS — D6869 Other thrombophilia: Secondary | ICD-10-CM | POA: Diagnosis not present

## 2022-08-23 DIAGNOSIS — F03A0 Unspecified dementia, mild, without behavioral disturbance, psychotic disturbance, mood disturbance, and anxiety: Secondary | ICD-10-CM | POA: Diagnosis not present

## 2022-08-23 DIAGNOSIS — Z6829 Body mass index (BMI) 29.0-29.9, adult: Secondary | ICD-10-CM | POA: Diagnosis not present

## 2022-08-30 DIAGNOSIS — N3020 Other chronic cystitis without hematuria: Secondary | ICD-10-CM | POA: Diagnosis not present

## 2022-08-30 DIAGNOSIS — N200 Calculus of kidney: Secondary | ICD-10-CM | POA: Diagnosis not present

## 2022-10-11 DIAGNOSIS — Z1231 Encounter for screening mammogram for malignant neoplasm of breast: Secondary | ICD-10-CM | POA: Diagnosis not present

## 2022-10-18 DIAGNOSIS — R922 Inconclusive mammogram: Secondary | ICD-10-CM | POA: Diagnosis not present

## 2022-12-20 DIAGNOSIS — N3021 Other chronic cystitis with hematuria: Secondary | ICD-10-CM | POA: Diagnosis not present

## 2022-12-20 DIAGNOSIS — N200 Calculus of kidney: Secondary | ICD-10-CM | POA: Diagnosis not present

## 2023-01-05 DIAGNOSIS — N390 Urinary tract infection, site not specified: Secondary | ICD-10-CM | POA: Diagnosis not present

## 2023-01-05 DIAGNOSIS — I480 Paroxysmal atrial fibrillation: Secondary | ICD-10-CM | POA: Diagnosis not present

## 2023-01-05 DIAGNOSIS — Z0000 Encounter for general adult medical examination without abnormal findings: Secondary | ICD-10-CM | POA: Diagnosis not present

## 2023-01-05 DIAGNOSIS — Z9181 History of falling: Secondary | ICD-10-CM | POA: Diagnosis not present

## 2023-01-05 DIAGNOSIS — D6869 Other thrombophilia: Secondary | ICD-10-CM | POA: Diagnosis not present

## 2023-01-05 DIAGNOSIS — E559 Vitamin D deficiency, unspecified: Secondary | ICD-10-CM | POA: Diagnosis not present

## 2023-01-05 DIAGNOSIS — F03A0 Unspecified dementia, mild, without behavioral disturbance, psychotic disturbance, mood disturbance, and anxiety: Secondary | ICD-10-CM | POA: Diagnosis not present

## 2023-01-05 DIAGNOSIS — E7800 Pure hypercholesterolemia, unspecified: Secondary | ICD-10-CM | POA: Diagnosis not present

## 2023-01-05 DIAGNOSIS — E66811 Obesity, class 1: Secondary | ICD-10-CM | POA: Diagnosis not present

## 2023-01-06 ENCOUNTER — Other Ambulatory Visit: Payer: Self-pay | Admitting: Cardiology

## 2023-01-06 DIAGNOSIS — I480 Paroxysmal atrial fibrillation: Secondary | ICD-10-CM

## 2023-01-07 NOTE — Telephone Encounter (Signed)
Xarelto 20mg  refill request received. Pt is 72 years old, weight-81.7kg, Crea-0.78 on 01/05/23 via [facility] from [facility], last seen by Dr. Herbie Baltimore on 06/09/22, Diagnosis-Afib, CrCl-100.38 mL/min; Dose is appropriate based on dosing criteria. Will send in refill to requested pharmacy.

## 2023-02-25 ENCOUNTER — Emergency Department (HOSPITAL_COMMUNITY)
Admission: EM | Admit: 2023-02-25 | Discharge: 2023-02-25 | Disposition: A | Discharge status: Home / Self Care | Payer: Medicare PPO | Attending: Emergency Medicine | Admitting: Emergency Medicine

## 2023-02-25 ENCOUNTER — Emergency Department (HOSPITAL_COMMUNITY): Payer: Medicare PPO

## 2023-02-25 ENCOUNTER — Encounter (HOSPITAL_COMMUNITY): Payer: Self-pay | Admitting: Emergency Medicine

## 2023-02-25 ENCOUNTER — Other Ambulatory Visit: Payer: Self-pay

## 2023-02-25 DIAGNOSIS — R1111 Vomiting without nausea: Secondary | ICD-10-CM | POA: Diagnosis not present

## 2023-02-25 DIAGNOSIS — Z79899 Other long term (current) drug therapy: Secondary | ICD-10-CM | POA: Insufficient documentation

## 2023-02-25 DIAGNOSIS — R112 Nausea with vomiting, unspecified: Secondary | ICD-10-CM | POA: Diagnosis not present

## 2023-02-25 DIAGNOSIS — I4891 Unspecified atrial fibrillation: Secondary | ICD-10-CM | POA: Diagnosis not present

## 2023-02-25 DIAGNOSIS — R9431 Abnormal electrocardiogram [ECG] [EKG]: Secondary | ICD-10-CM | POA: Diagnosis not present

## 2023-02-25 DIAGNOSIS — Z7901 Long term (current) use of anticoagulants: Secondary | ICD-10-CM | POA: Insufficient documentation

## 2023-02-25 DIAGNOSIS — Z1152 Encounter for screening for COVID-19: Secondary | ICD-10-CM | POA: Insufficient documentation

## 2023-02-25 DIAGNOSIS — S80919A Unspecified superficial injury of unspecified knee, initial encounter: Secondary | ICD-10-CM | POA: Diagnosis not present

## 2023-02-25 DIAGNOSIS — M25561 Pain in right knee: Secondary | ICD-10-CM | POA: Diagnosis not present

## 2023-02-25 DIAGNOSIS — R55 Syncope and collapse: Secondary | ICD-10-CM | POA: Insufficient documentation

## 2023-02-25 DIAGNOSIS — J984 Other disorders of lung: Secondary | ICD-10-CM | POA: Diagnosis not present

## 2023-02-25 DIAGNOSIS — R42 Dizziness and giddiness: Secondary | ICD-10-CM | POA: Diagnosis not present

## 2023-02-25 DIAGNOSIS — Q048 Other specified congenital malformations of brain: Secondary | ICD-10-CM | POA: Diagnosis not present

## 2023-02-25 LAB — LIPASE, BLOOD: Lipase: 25 U/L (ref 11–51)

## 2023-02-25 LAB — URINALYSIS, ROUTINE W REFLEX MICROSCOPIC
Bilirubin Urine: NEGATIVE
Glucose, UA: NEGATIVE mg/dL
Hgb urine dipstick: NEGATIVE
Ketones, ur: 5 mg/dL — AB
Leukocytes,Ua: NEGATIVE
Nitrite: NEGATIVE
Protein, ur: NEGATIVE mg/dL
Specific Gravity, Urine: 1.023 (ref 1.005–1.030)
pH: 6 (ref 5.0–8.0)

## 2023-02-25 LAB — CBC
HCT: 49.2 % — ABNORMAL HIGH (ref 36.0–46.0)
Hemoglobin: 16 g/dL — ABNORMAL HIGH (ref 12.0–15.0)
MCH: 30.3 pg (ref 26.0–34.0)
MCHC: 32.5 g/dL (ref 30.0–36.0)
MCV: 93.2 fL (ref 80.0–100.0)
Platelets: 204 10*3/uL (ref 150–400)
RBC: 5.28 MIL/uL — ABNORMAL HIGH (ref 3.87–5.11)
RDW: 12.9 % (ref 11.5–15.5)
WBC: 10.7 10*3/uL — ABNORMAL HIGH (ref 4.0–10.5)
nRBC: 0 % (ref 0.0–0.2)

## 2023-02-25 LAB — COMPREHENSIVE METABOLIC PANEL
ALT: 19 U/L (ref 0–44)
AST: 22 U/L (ref 15–41)
Albumin: 3.8 g/dL (ref 3.5–5.0)
Alkaline Phosphatase: 94 U/L (ref 38–126)
Anion gap: 9 (ref 5–15)
BUN: 18 mg/dL (ref 8–23)
CO2: 26 mmol/L (ref 22–32)
Calcium: 9.3 mg/dL (ref 8.9–10.3)
Chloride: 104 mmol/L (ref 98–111)
Creatinine, Ser: 0.92 mg/dL (ref 0.44–1.00)
GFR, Estimated: >60 mL/min (ref >60)
Glucose, Bld: 133 mg/dL — ABNORMAL HIGH (ref 70–99)
Potassium: 4 mmol/L (ref 3.5–5.1)
Sodium: 139 mmol/L (ref 135–145)
Total Bilirubin: 0.5 mg/dL (ref 0.0–1.2)
Total Protein: 7.3 g/dL (ref 6.5–8.1)

## 2023-02-25 LAB — MAGNESIUM: Magnesium: 2.1 mg/dL (ref 1.7–2.4)

## 2023-02-25 LAB — RESP PANEL BY RT-PCR (RSV, FLU A&B, COVID)  RVPGX2
Influenza A by PCR: NEGATIVE
Influenza B by PCR: NEGATIVE
Resp Syncytial Virus by PCR: NEGATIVE
SARS Coronavirus 2 by RT PCR: NEGATIVE

## 2023-02-25 MED ORDER — ONDANSETRON 4 MG PO TBDP
4.0000 mg | ORAL_TABLET | Freq: Once | ORAL | Status: AC
  Administered 2023-02-25: 4 mg via ORAL
  Filled 2023-02-25: qty 1

## 2023-02-25 MED ORDER — ONDANSETRON 4 MG PO TBDP: 4.0000 mg | ORAL_TABLET | Freq: Three times a day (TID) | ORAL | 0 refills | Status: AC | PRN

## 2023-02-25 NOTE — ED Provider Triage Note (Signed)
Emergency Medicine Provider Triage Evaluation Note  [name] , a 73 y.o. female  was evaluated in triage.  Patient brought back to triage as she states she felt like she was going into A-fib.  EKG repeated.  She had a couple episodes of emesis. Denies any chest pain or shortness of breath  Patient also given Zofran.  CT head placed given patient had syncopal event earlier and is on Xarelto, now with vomiting.  Review of Systems  Positive: As above Negative: As above  Physical Exam  BP 131/69 (BP Location: Right Arm)   Pulse 85   Temp 97.8 F (36.6 C) (Oral)   Resp 15   SpO2 96%  Gen:   Awake, no distress   Resp:  Normal effort  MSK:   Moves extremities without difficulty  Other:  Having emesis. Otherwise well appearing   Irregularly irregular rhythm. No tachycardia  Medical Decision Making  Medically screening exam initiated at 1:24 PM.  Appropriate orders placed.  Yvonne Wheeler was informed that the remainder of the evaluation will be completed by another provider, this initial triage assessment does not replace that evaluation, and the importance of remaining in the ED until their evaluation is complete.     Yvonne Merles, PA-C 02/25/23 1326

## 2023-02-25 NOTE — Discharge Instructions (Addendum)
Take the Zofran as needed for nausea and vomiting.  Return for any new or worse symptoms.  Make an appointment to follow-up with your doctor.

## 2023-02-25 NOTE — ED Provider Notes (Addendum)
[place] EMERGENCY DEPARTMENT AT [facility] Provider Note   CSN: [id number] Arrival date & time: 02/25/23  1035     History  Chief Complaint  Patient presents with   Emesis   Loss of Consciousness    Yvonne Wheeler is a 73 y.o. female.  Patient with acute onset of vomiting.  Patient was trying to get to the bathroom had a syncopal episode states she was nausea while eating walk to the bathroom passed out and vomited.  Did not hit her head.  Patient does take Xarelto.  Patient is also on Cardizem CD.  Patient does have a history of atrial fibrillation.  Patient had cardioversion in 2014.  Patient states she did vomit in the waiting room several times.  But has not vomited recently.  Overall feeling better.  There have been some sick contacts at their assisted living facility.  Patient denies any abdominal pain.  Cardiac monitoring here without any significant abnormalities.  Patient denies any diarrhea.       Home Medications Prior to Admission medications   Medication Sig Start Date End Date Taking? Authorizing Provider  acetaminophen (TYLENOL) 500 MG tablet Take 1,000 mg by mouth every 6 (six) hours as needed for headache.     [provider]  cycloSPORINE (RESTASIS) 0.05 % ophthalmic emulsion Place 1 drop into both eyes every 12 (twelve) hours.    [provider]  diltiazem (CARDIZEM CD) 240 MG 24 hr capsule Take 1 capsule (240 mg total) by mouth daily. 06/15/22 06/15/23  Marykay Lex, MD  flecainide (TAMBOCOR) 100 MG tablet Take 1 tablet (100 mg total) by mouth 2 (two) times daily. 07/12/22   Marykay Lex, MD  latanoprost (XALATAN) 0.005 % ophthalmic solution Place 1 drop into both eyes at bedtime. 04/11/14   [provider]  Multiple Vitamin (MULITIVITAMIN WITH MINERALS) TABS Take 1 tablet by mouth daily.    [provider]  rosuvastatin (CRESTOR) 5 MG tablet TAKE ONE TABLET BY MOUTH DAILY 05/14/22   Marykay Lex, MD   XARELTO 20 MG TABS tablet TAKE ONE TABLET BY MOUTH DAILY 01/07/23   Marykay Lex, MD      Allergies    Codeine and Sulfonamide derivatives    Review of Systems   Review of Systems  Constitutional:  Negative for chills and fever.  HENT:  Negative for ear pain and sore throat.   Eyes:  Negative for pain and visual disturbance.  Respiratory:  Negative for cough and shortness of breath.   Cardiovascular:  Negative for chest pain and palpitations.  Gastrointestinal:  Positive for nausea and vomiting. Negative for abdominal pain.  Genitourinary:  Negative for dysuria and hematuria.  Musculoskeletal:  Negative for arthralgias and back pain.  Skin:  Negative for color change and rash.  Neurological:  Positive for syncope. Negative for dizziness, seizures, weakness, numbness and headaches.  All other systems reviewed and are negative.   Physical Exam Updated Vital Signs BP 134/61   Pulse 85   Temp 98.1 F (36.7 C)   Resp 20   SpO2 100%  Physical Exam Vitals and nursing note reviewed.  Constitutional:      General: She is not in acute distress.    Appearance: Normal appearance. She is well-developed.  HENT:     Head: Normocephalic and atraumatic.  Eyes:     Extraocular Movements: Extraocular movements intact.     Conjunctiva/sclera: Conjunctivae normal.     Pupils: Pupils are equal,  round, and reactive to light.  Cardiovascular:     Rate and Rhythm: Normal rate and regular rhythm.     Heart sounds: No murmur heard. Pulmonary:     Effort: Pulmonary effort is normal. No respiratory distress.     Breath sounds: Normal breath sounds. No wheezing, rhonchi or rales.  Abdominal:     General: There is no distension.     Palpations: Abdomen is soft.     Tenderness: There is no abdominal tenderness. There is no guarding.  Musculoskeletal:        General: No swelling.     Cervical back: Neck supple.  Skin:    General: Skin is warm and dry.     Capillary Refill: Capillary refill  takes less than 2 seconds.  Neurological:     General: No focal deficit present.     Mental Status: She is alert and oriented to person, place, and time.  Psychiatric:        Mood and Affect: Mood normal.     ED Results / Procedures / Treatments   Labs (all labs ordered are listed, but only abnormal results are displayed) Labs Reviewed  COMPREHENSIVE METABOLIC PANEL - Abnormal; Notable for the following components:      Result Value   Glucose, Bld 133 (*)    All other components within normal limits  CBC - Abnormal; Notable for the following components:   WBC 10.7 (*)    RBC 5.28 (*)    Hemoglobin 16.0 (*)    HCT 49.2 (*)    All other components within normal limits  RESP PANEL BY RT-PCR (RSV, FLU A&B, COVID)  RVPGX2  LIPASE, BLOOD  URINALYSIS, ROUTINE W REFLEX MICROSCOPIC  MAGNESIUM  CBG MONITORING, ED    EKG EKG Interpretation Date/Time:  Friday February 25 2023 13:26:08 EST Ventricular Rate:  85 PR Interval:    QRS Duration:  80 QT Interval:  364 QTC Calculation: 433 R Axis:   60  Text Interpretation: Atrial fibrillation Abnormal ECG When compared with ECG of 25-Feb-2023 10:44, PREVIOUS ECG IS PRESENT No significant change since last tracing Confirmed by Vanetta Mulders [phone]) on 02/25/2023 4:12:45 PM  Radiology CT Head Wo Contrast Result Date: 02/25/2023 CLINICAL DATA:  Provided history: Syncope now with vomiting. On Xarelto. EXAM: CT HEAD WITHOUT CONTRAST TECHNIQUE: Contiguous axial images were obtained from the base of the skull through the vertex without intravenous contrast. RADIATION DOSE REDUCTION: This exam was performed according to the departmental dose-optimization program which includes automated exposure control, adjustment of the mA and/or kV according to patient size and/or use of iterative reconstruction technique. COMPARISON:  Brain MRI 01/02/2020. FINDINGS: Brain: No age-advanced or lobar predominant parenchymal atrophy. There is no acute intracranial  hemorrhage. No demarcated cortical infarct. No extra-axial fluid collection. No evidence of an intracranial mass. No midline shift. Vascular: No hyperdense vessel. Atherosclerotic calcifications. 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 Skull: No calvarial fracture or aggressive osseous lesion. Sinuses/Orbits: No mass or acute finding within the imaged orbits. No significant paranasal sinus disease at the imaged levels. IMPRESSION: No evidence of an acute intracranial abnormality. Electronically Signed   By: Jackey Loge D.O.   On: 02/25/2023 15:05   DG Chest 2 View Result Date: 02/25/2023 CLINICAL DATA:  Syncope while in the bathroom this morning. EXAM: CHEST - 2 VIEW COMPARISON:  Chest CT, 10/04/2012. FINDINGS: Normal heart, mediastinum and hila. Lungs are mildly hyperexpanded. There is mild apical pleuroparenchymal scarring. Remainder of the lungs is  clear. No pleural effusion or pneumothorax. Skeletal structures are unremarkable. IMPRESSION: No active cardiopulmonary disease. Electronically Signed   By: Amie Portland M.D.   On: 02/25/2023 12:04    Procedures Procedures    Medications Ordered in ED Medications  ondansetron (ZOFRAN-ODT) disintegrating tablet 4 mg (4 mg Oral Given 02/25/23 1338)  ondansetron (ZOFRAN-ODT) disintegrating tablet 4 mg (4 mg Oral Given 02/25/23 1652)    ED Course/ Medical Decision Making/ A&P                                 Medical Decision Making Amount and/or Complexity of Data Reviewed Labs: ordered.  Risk Prescription drug management.   Patient now feeling much better.  Respiratory panel negative for COVID flu and RSV.  Lipase normal at 25 complete metabolic panel normal glucose 409 renal function GFR is greater than 60 anion gap is 9 white blood cell count up a little bit at 10.7 hemoglobin 16.0 platelets 204.  Magnesium is pending.  Urinalysis is also pending.  CT head no evidence of an acute intracranial abnormality 2 view  chest x-ray without any cardiopulmonary disease.  EKG showed atrial fibrillation rate controlled. Cardiac monitor here without any significant arrhythmias.  Patient did have labs pending.  Already mention magnesium is pending.  Magnesium did come back at 2.1.  These were not ordered by me.  Urinalysis also negative.  Patient doing fine here after the Zofran.  Patient stable for discharge home.  Suspect a viral gastritis.    Final Clinical Impression(s) / ED Diagnoses Final diagnoses:  Nausea and vomiting, unspecified vomiting type  Vasovagal syncope    Rx / DC Orders ED Discharge Orders     None         Vanetta Mulders, MD 02/25/23 1713    Vanetta Mulders, MD 02/25/23 1836

## 2023-02-25 NOTE — ED Provider Triage Note (Signed)
Emergency Medicine Provider Triage Evaluation Note  Yvonne Wheeler , a 73 y.o. female  was evaluated in triage.  Pt complains of syncopal event at home.  She reports getting up and having breakfast this morning and then was headed to the bathroom because she was nauseated.  She reports she felt herself going down because she knew she was going to pass out.  She reports feeling hot and nauseated before this.  She does remember hitting her left arm but denies hitting her head.  She has no headache or neck pain.  Has had syncope in the past but last episode was maybe 5 years ago.  Denies any chest pain, shortness of breath, abdominal pain or nausea at this time..  Review of Systems  Positive: Syncope, nausea and vomiting Negative: Chest pain, shortness of breath  Physical Exam  BP 131/69 (BP Location: Right Arm)   Pulse 85   Temp 97.8 F (36.6 C) (Oral)   Resp 15   SpO2 96%  Gen:   Awake, no distress   Resp:  Normal effort  MSK:   Moves extremities without difficulty  Other:  Cardiac exam with irregularly irregular beat, no pain to the neck or evidence of trauma to the head.  Medical Decision Making  Medically screening exam initiated at 11:18 AM.  Appropriate orders placed.  Yvonne Wheeler was informed that the remainder of the evaluation will be completed by another provider, this initial triage assessment does not replace that evaluation, and the importance of remaining in the ED until their evaluation is complete.     Gwyneth Sprout, MD 02/25/23 [phone]

## 2023-02-25 NOTE — ED Triage Notes (Signed)
Pt Bib [facility] from home with reports of emesis and syncope. Pt states she was nauseas while eating, walked to the bathroom and passed out then vomited. Did not hit her head. Pt does take Xarelto.

## 2023-02-25 NOTE — ED Notes (Signed)
Pt d/c home per EDP order . Discharge summary reviewed, pt verbalizes understanding. Discharged home with husband. NAD.

## 2023-04-16 ENCOUNTER — Other Ambulatory Visit: Payer: Self-pay | Admitting: Cardiology

## 2023-04-19 DIAGNOSIS — E7800 Pure hypercholesterolemia, unspecified: Secondary | ICD-10-CM | POA: Diagnosis not present

## 2023-05-02 ENCOUNTER — Encounter: Payer: Self-pay | Admitting: Cardiology

## 2023-05-02 ENCOUNTER — Ambulatory Visit: Payer: Medicare PPO | Attending: Cardiology | Admitting: Cardiology

## 2023-05-02 VITALS — BP 138/76 | HR 84 | Ht 65.0 in | Wt 178.2 lb

## 2023-05-02 DIAGNOSIS — R600 Localized edema: Secondary | ICD-10-CM | POA: Diagnosis not present

## 2023-05-02 DIAGNOSIS — Z7901 Long term (current) use of anticoagulants: Secondary | ICD-10-CM | POA: Diagnosis not present

## 2023-05-02 DIAGNOSIS — I10 Essential (primary) hypertension: Secondary | ICD-10-CM

## 2023-05-02 DIAGNOSIS — E785 Hyperlipidemia, unspecified: Secondary | ICD-10-CM | POA: Diagnosis not present

## 2023-05-02 DIAGNOSIS — I480 Paroxysmal atrial fibrillation: Secondary | ICD-10-CM

## 2023-05-02 MED ORDER — FLECAINIDE ACETATE 100 MG PO TABS
100.0000 mg | ORAL_TABLET | Freq: Two times a day (BID) | ORAL | Status: AC
Start: 2023-05-02 — End: ?

## 2023-05-02 NOTE — Patient Instructions (Addendum)
 Medication Instructions:   Once you go out of Afib   decrease your Flecainide to 50 mg  ( 1/2 tablet of 100 mg twice a day ) after this episode   If you go into Afib again increase Flecainide back to 100 mg twice  a day until it stops and you go back to regular rhythm  you will return to 50 mg twice a day   *If you need a refill on your cardiac medications before your next appointment, please call your pharmacy*   Lab Work: Not needed    Testing/Procedures:  Not needed  Follow-Up: At [facility], you and your health needs are our priority.  As part of our continuing mission to provide you with exceptional heart care, we have created designated Provider Care Teams.  These Care Teams include your primary Cardiologist (physician) and Advanced Practice Providers (APPs -  Physician Assistants and Nurse Practitioners) who all work together to provide you with the care you need, when you need it.     Your next appointment:   6 month(s)  The format for your next appointment:   In Person  Provider:   Bryan Lemma, MD    Other Instructions    Dr Herbie Baltimore recommend you  charge your  Apple Watch   s

## 2023-05-02 NOTE — Progress Notes (Unsigned)
 Cardiology Office Note:  .   Date:  05/04/2023  ID:  Yvonne Wheeler, DOB 1950-12-13, MRN [id number] PCP: Sigmund Hazel, MD  [facility] Providers Cardiologist:  Bryan Lemma, MD     Chief Complaint  Patient presents with   Follow-up    Doing okay   Atrial Fibrillation    Still having some intermittent breakthrough spells.    Patient Profile: .     Yvonne Wheeler is a 73 y.o. female  with a PMH notable for PAF (rhythm controlled with flecainide and diltiazem; Xarelto), and HTN who presents here for annual follow-up at the request of Sigmund Hazel, MD.    Yvonne Wheeler was last seen in May 2024 to establish cardiology care after Dr. Katrinka Blazing retired.  She transferred care to me because her husband Peyton Najjar is also a patient of mine.  She was doing well with only h aving breakthrough spells of A-fib once twice a month but she does not necessarily notice the irregular heartbeats.  She does notes being fatigued and worn out.  Episodes last no more than 24 hours.  No chest pain or pressure.  Was having some issues with memory loss  Subjective  Discussed the use of AI scribe software for clinical note transcription with the patient, who gave verbal consent to proceed.  History of Present Illness   Yvonne Wheeler is a 73 year old female with atrial fibrillation who presents for follow-up.  She is currently in atrial fibrillation but does not feel symptomatic, which is unusual for her as she can usually detect when she is in AFib. Her heart rate is 84 bpm. She recalls a previous episode in January when she was in AFib, associated with nausea and a visit to the hospital, where she passed out and spent the day in the ER. At that time, she was not on medication, possibly due to an inability to keep it down.  She usually feels 'puny' when in AFib, prompting her to slow down and take it easy. No chest pain, pressure, shortness of breath, or waking up at night due to breathing difficulties.  Sometimes, she wakes up in the middle of the night and can tell she had AFib but is able to go back to sleep. She does not keep track of how long these episodes last but notes they are not very long.  She is currently taking flecainide 100 mg twice a day, diltiazem 240 mg daily, and Xarelto for stroke prevention related to AFib. She also takes rosuvastatin (Crestor) 5 mg, which she had stopped briefly but resumed.  She has noticed some swelling in her ankle over the past few days, which she describes as 'puffy' rather than true edema. She attributes it to tight socks.  Her labs from April 19, 2023, show a total cholesterol of 169, HDL of 74, LDL of 85, and triglycerides of 52.        Objective  Medications - Flecainide 100 mg twice daily - Diltiazem CD 240 mg daily - Rosuvastatin 5 mg daily (recently restarted - Xarelto 20 mg daily  Studies Reviewed: Marland Kitchen   EKG Interpretation Date/Time:  Monday May 02 2023 10:05:47 EDT Ventricular Rate:  84 PR Interval:    QRS Duration:  90 QT Interval:  380 QTC Calculation: 449 R Axis:   17  Text Interpretation: Atrial fibrillation CONTROLLED VENTRICULAR RESPONSE When compared with ECG of 25-Feb-2023 13:26, Nonspecific T wave abnormality no longer evident in Lateral leads Confirmed by  Bryan Lemma ([id number]) on 05/02/2023 10:18:04 AM    Recent labs checked by PCP did not include lipid panel. Component Ref Range & Units (hover) 2 mo ago (02/25/23)  Sodium 139  Potassium 4.0  Chloride 104  CO2 26  Glucose, Bld 133 High   BUN 18  Creatinine, Ser 0.92  Calcium 9.3  Total Protein 7.3  Albumin 3.8  AST 22  ALT 19  Alkaline Phosphatase 94  Total Bilirubin 0.5     LABS from [facility] Total cholesterol: 169 (04/19/2023) HDL: 74 (04/19/2023) LDL: 85 (04/19/2023) Triglycerides: 52 (04/19/2023)  No recent studies to function on here  Risk Assessment/Calculations:    CHA2DS2-VASc Score = 3   This indicates a 3.2% annual risk of stroke. The  patient's score is based upon: CHF History: 0 HTN History: 1 Diabetes History: 0 Stroke History: 0 Vascular Disease History: 0 Age Score: 1 Gender Score: 1   Patient on longstanding DOAC       Physical Exam:   VS:  BP 138/76 (BP Location: Right Arm, Patient Position: Sitting, Cuff Size: Normal)   Pulse 84   Ht 5\' 5"  (1.651 m)   Wt 178 lb 3.2 oz (80.8 kg)   SpO2 97%   BMI 29.65 kg/m    Wt Readings from Last 3 Encounters:  05/02/23 178 lb 3.2 oz (80.8 kg)  06/09/22 180 lb 3.2 oz (81.7 kg)  04/23/21 177 lb (80.3 kg)    GEN: Well nourished, well developed in no acute distress; healthy-appearing.  Well-groomed. NECK: No JVD; No carotid bruits CARDIAC: ; RRR with occasional ectopy, Normal S1, S2; no murmurs, rubs, gallops RESPIRATORY:  Clear to auscultation without rales, wheezing or rhonchi ; nonlabored, good air movement. ABDOMEN: Soft, non-tender, non-distended EXTREMITIES:  No edema; No deformity      ASSESSMENT AND PLAN: .    Problem List Items Addressed This Visit       Cardiology Problems   Essential hypertension (Chronic)   Blood pressure borderline elevated. On diltiazem, which aids in rate control for AFib. - Continue diltiazem CD2 140 mg daily for blood pressure management.      Relevant Medications   flecainide (TAMBOCOR) 100 MG tablet   Hyperlipidemia LDL goal <100, but target less than 70 (Chronic)   Lipid panel: total cholesterol 169 mg/dL, HDL 74 mg/dL, LDL 85 mg/dL, triglycerides 52 mg/dL. LDL below 100 mg/dL, close to target of less than 70 mg/dL. On rosuvastatin (Crestor) 5 mg, temporarily stopped but resumed. - Continue rosuvastatin 5 mg daily.      Relevant Medications   flecainide (TAMBOCOR) 100 MG tablet   Paroxysmal atrial fibrillation (HCC) - Primary (Chronic)   Currently in atrial fibrillation, confirmed by EKG, asymptomatic with heart rate controlled at 84 bpm.  Hospitalized in January for GI bug and found to be in AFib.  On flecainide  and diltiazem for rate control.  Reports feeling 'puny' during AFib but no chest pain or dyspnea.  Monitoring with watch to detect AFib episodes with symptoms and [employer].  Xarelto provides stroke protection, but hold if significant bleeding or procedures occur. - Set up monitoring on watch to detect AFib episodes. - Adjust flecainide dosage: reduce to 50 mg twice a day when not in AFib, increase to 100 mg twice a day during AFib episodes until resolved. - Continue diltiazem 240 mg daily for rate control. - Monitor for prolonged AFib episodes, as these may require further intervention. - Educate on holding Xarelto for a couple of days if  procedures or significant bleeding occur.      Relevant Medications   flecainide (TAMBOCOR) 100 MG tablet   Other Relevant Orders   EKG 12-Lead (Completed)     Other   Long term current use of anticoagulant therapy (Chronic)   Elevated CHA2DS2-VASc or of 3. On Xarelto with intermittent recurrent episodes of A-fib.  Okay to hold Xarelto 2 to 3 days preop for surgeries or procedures. Okay to hold 1 to 2 days for significant bleeding.      Peripheral edema   Noticed more swelling in one ankle recently. Swelling not significant, possibly related to tight socks rather than true edema. - Advise on wearing looser socks if necessary.         Follow-up Due for follow-up to monitor AFib and overall cardiac health. - Schedule follow-up appointment in 6 months. Return in about 6 months (around 11/01/2023) for Routine follow up with me, [employer].     Signed, Marykay Lex, MD, MS Bryan Lemma, M.D., M.S. Interventional Cardiologist  [facility]  Pager # [phone] Phone # [phone] [address] [place], [place] [address]

## 2023-05-04 ENCOUNTER — Encounter: Payer: Self-pay | Admitting: Cardiology

## 2023-05-04 DIAGNOSIS — R600 Localized edema: Secondary | ICD-10-CM | POA: Insufficient documentation

## 2023-05-04 NOTE — Assessment & Plan Note (Signed)
 Lipid panel: total cholesterol 169 mg/dL, HDL 74 mg/dL, LDL 85 mg/dL, triglycerides 52 mg/dL. LDL below 100 mg/dL, close to target of less than 70 mg/dL. On rosuvastatin (Crestor) 5 mg, temporarily stopped but resumed. - Continue rosuvastatin 5 mg daily.

## 2023-05-04 NOTE — Assessment & Plan Note (Signed)
 Blood pressure borderline elevated. On diltiazem, which aids in rate control for AFib. - Continue diltiazem CD2 140 mg daily for blood pressure management.

## 2023-05-04 NOTE — Assessment & Plan Note (Signed)
 Elevated CHA2DS2-VASc or of 3. On Xarelto with intermittent recurrent episodes of A-fib.  Okay to hold Xarelto 2 to 3 days preop for surgeries or procedures. Okay to hold 1 to 2 days for significant bleeding.

## 2023-05-04 NOTE — Assessment & Plan Note (Signed)
 Currently in atrial fibrillation, confirmed by EKG, asymptomatic with heart rate controlled at 84 bpm.  Hospitalized in January for GI bug and found to be in AFib.  On flecainide and diltiazem for rate control.  Reports feeling 'puny' during AFib but no chest pain or dyspnea.  Monitoring with watch to detect AFib episodes with symptoms and [employer].  Xarelto provides stroke protection, but hold if significant bleeding or procedures occur. - Set up monitoring on watch to detect AFib episodes. - Adjust flecainide dosage: reduce to 50 mg twice a day when not in AFib, increase to 100 mg twice a day during AFib episodes until resolved. - Continue diltiazem 240 mg daily for rate control. - Monitor for prolonged AFib episodes, as these may require further intervention. - Educate on holding Xarelto for a couple of days if procedures or significant bleeding occur.

## 2023-05-04 NOTE — Assessment & Plan Note (Signed)
 Noticed more swelling in one ankle recently. Swelling not significant, possibly related to tight socks rather than true edema. - Advise on wearing looser socks if necessary.

## 2023-05-31 DIAGNOSIS — H2513 Age-related nuclear cataract, bilateral: Secondary | ICD-10-CM | POA: Diagnosis not present

## 2023-05-31 DIAGNOSIS — H04123 Dry eye syndrome of bilateral lacrimal glands: Secondary | ICD-10-CM | POA: Diagnosis not present

## 2023-05-31 DIAGNOSIS — H40053 Ocular hypertension, bilateral: Secondary | ICD-10-CM | POA: Diagnosis not present

## 2023-05-31 DIAGNOSIS — H524 Presbyopia: Secondary | ICD-10-CM | POA: Diagnosis not present

## 2023-05-31 DIAGNOSIS — H52223 Regular astigmatism, bilateral: Secondary | ICD-10-CM | POA: Diagnosis not present

## 2023-06-22 DIAGNOSIS — H40053 Ocular hypertension, bilateral: Secondary | ICD-10-CM | POA: Diagnosis not present

## 2023-07-11 ENCOUNTER — Other Ambulatory Visit: Payer: Self-pay | Admitting: Cardiology

## 2023-07-11 DIAGNOSIS — I480 Paroxysmal atrial fibrillation: Secondary | ICD-10-CM

## 2023-07-19 ENCOUNTER — Other Ambulatory Visit: Payer: Self-pay | Admitting: Cardiology

## 2023-07-21 ENCOUNTER — Other Ambulatory Visit: Payer: Self-pay | Admitting: Cardiology

## 2023-07-21 DIAGNOSIS — I480 Paroxysmal atrial fibrillation: Secondary | ICD-10-CM

## 2023-07-21 NOTE — Telephone Encounter (Signed)
 Prescription refill request for Xarelto  received.  Indication: Afib  Last office visit: 05/02/23 Yvonne Wheeler)  Weight: 80.8kg Age: 73 Scr: 0.92 (02/25/23)  CrCl: 71.81ml/min  Appropriate dose. Refill sent.

## 2023-10-17 DIAGNOSIS — Z1231 Encounter for screening mammogram for malignant neoplasm of breast: Secondary | ICD-10-CM | POA: Diagnosis not present

## 2023-10-19 DIAGNOSIS — Z23 Encounter for immunization: Secondary | ICD-10-CM | POA: Diagnosis not present

## 2023-11-16 ENCOUNTER — Ambulatory Visit: Attending: Cardiovascular Disease | Admitting: Cardiology

## 2023-11-16 ENCOUNTER — Encounter: Payer: Self-pay | Admitting: Cardiology

## 2023-11-16 VITALS — BP 110/60 | HR 67 | Ht 65.0 in | Wt 182.2 lb

## 2023-11-16 DIAGNOSIS — I10 Essential (primary) hypertension: Secondary | ICD-10-CM

## 2023-11-16 DIAGNOSIS — Z5181 Encounter for therapeutic drug level monitoring: Secondary | ICD-10-CM | POA: Diagnosis not present

## 2023-11-16 DIAGNOSIS — Z79899 Other long term (current) drug therapy: Secondary | ICD-10-CM

## 2023-11-16 DIAGNOSIS — I480 Paroxysmal atrial fibrillation: Secondary | ICD-10-CM | POA: Diagnosis not present

## 2023-11-16 DIAGNOSIS — Z7901 Long term (current) use of anticoagulants: Secondary | ICD-10-CM

## 2023-11-16 NOTE — Patient Instructions (Signed)

## 2023-11-16 NOTE — Assessment & Plan Note (Signed)
 Well-controlled blood pressure.  She remains on diltiazem  240 mg daily

## 2023-11-16 NOTE — Progress Notes (Signed)
 Cardiology Office Note:  . 2-month follow-up  Date:  11/16/2023  ID:  Yvonne Wheeler, DOB 15-May-1950, MRN [id number] PCP: Cleotilde Planas, MD  [facility] Providers Cardiologist:  Alm Clay, MD     Chief Complaint  Patient presents with   Follow-up    Doing well   Atrial Fibrillation    Felt like she went back into atrial fibrillation just prior to driving into this clinic; episodes do not last for 2 hours maybe twice a week.    Patient Profile: .     Yvonne Wheeler is a borderline obese 73 y.o. female  with a PMH reviewed below who presents here for 37-month follow-up.  PMH  PAF (rhythm controlled with flecainide  and diltiazem ; Xarelto ),  HTN      Yvonne Wheeler was last seen on May 02, 2023.  She was actually in A-fib but was somewhat asymptomatic which was unusual because she can usually tell when she is in A-fib.  She recalls a previous episode in January that she had nausea and dizziness associated with A-fib.  She actually went to the hospital and spent a day in the ER after passing out.  Usually A-fib makes her feel tired and worn out.  Usually, she does not have any chest pain/pressure or dyspnea associated with A-fib.  She was maintained on 100 mg twice daily flecainide  and 240 mg of diltiazem  along with Xarelto .  She had restarted her rosuvastatin  after a brief pause.  LDL was relatively well-controlled at 85. -> We planned to reduce her maintenance dose of flecainide  to 50 mg twice daily when not in A-fib.  If she were to go in A-fib she would increase to 100 mg twice a day until resolved.   Subjective  Discussed the use of AI scribe software for clinical note transcription with the patient, who gave verbal consent to proceed.  History of Present Illness Yvonne Wheeler is a 73 year old female with atrial fibrillation who presents for follow-up regarding her arrhythmia management.  She experiences episodes of atrial fibrillation more frequently than she can  perceive, estimating occurrences twice a month. She notices an irregular heartbeat but does not feel an increase in heart rate. Episodes typically last about an hour, with a longer episode noted today while driving to the appointment.  During these episodes, she continues her activities at a slower pace without significant limitations. No chest pain, pressure, or tightness is reported, and she does not experience shortness of breath while lying flat or waking up. She denies any bleeding issues, such as blood in stools or urine, epistaxis, or calf pain when walking.  She is currently taking flecainide  100 mg twice daily. She mentions occasional swelling in her leg, particularly on the side where she had a meniscus repair, which reduces with elevation or removal of tight socks and shoes.  She maintains an active lifestyle, walking regularly and keeping a steady pace, which she finds beneficial. No recent illnesses, fevers, chills, or cold sweats.   Cardiovascular ROS: positive for - irregular heartbeat, palpitations, and this is usually related to brief spells of Afib-- notes more fatigue & some dyspnea negative for - chest pain, edema, orthopnea, paroxysmal nocturnal dyspnea, rapid heart rate, shortness of breath, or syncope/near syncope, TIA/amaurosis fugax; claudication;Yvonne Wheeler, hematochezia, hematuria, epistaxis  ROS:  Review of Systems - Negative except Sx with intermittent Afib & memory loss.     Objective   Current Meds  Medication Sig   acetaminophen (  TYLENOL) 500 MG tablet Take 1,000 mg by mouth every 6 (six) hours as needed for headache.    cycloSPORINE (RESTASIS) 0.05 % ophthalmic emulsion Place 1 drop into both eyes every 12 (twelve) hours.   diltiazem  (CARDIZEM  CD) 240 MG 24 hr capsule TAKE ONE CAPSULE BY MOUTH DAILY   flecainide  (TAMBOCOR ) 100 MG tablet TAKE ONE TABLET BY MOUTH TWICE DAILY   latanoprost (XALATAN) 0.005 % ophthalmic solution Place 1 drop into both eyes at bedtime.    Multiple Vitamin (MULITIVITAMIN WITH MINERALS) TABS Take 1 tablet by mouth daily.   rivaroxaban  (XARELTO ) 20 MG TABS tablet TAKE ONE TABLET BY MOUTH DAILY   rosuvastatin  (CRESTOR ) 5 MG tablet Take 1 tablet (5 mg total) by mouth daily.    Studies Reviewed: Yvonne   EKG Interpretation Date/Time:  Wednesday November 16 2023 09:30:50 EDT Ventricular Rate:  67 PR Interval:    QRS Duration:  92 QT Interval:  402 QTC Calculation: 424 R Axis:   30  Text Interpretation: Atrial fibrillation CONTROLLED VENTRICULAR RESPONSE When compared with ECG of 02-May-2023 10:05, No significant change was found Confirmed by Anner Lenis ([id number]) on 11/16/2023 9:54:08 AM    Labs from PCP ([facility]): 04/19/2023: TC 169, HDL 74, LDL 85, TG 52 Hgb 16, Cr 0.92, K+ 4.0.  Risk Assessment/Calculations:    CHA2DS2-VASc Score = 3   This indicates a 3.2% annual risk of stroke. The patient's score is based upon: CHF History: 0 HTN History: 1 Diabetes History: 0 Stroke History: 0 Vascular Disease History: 0 Age Score: 1 Gender Score: 1             Physical Exam:   VS:  BP 110/60 (BP Location: Left Arm, Patient Position: Sitting, Cuff Size: Normal)   Pulse 67   Ht 5' 5 (1.651 m)   Wt 182 lb 3.2 oz (82.6 kg)   SpO2 98%   BMI 30.32 kg/m    Wt Readings from Last 3 Encounters:  11/16/23 182 lb 3.2 oz (82.6 kg)  05/02/23 178 lb 3.2 oz (80.8 kg)  06/09/22 180 lb 3.2 oz (81.7 kg)     GEN: Healthy-appearing; Well nourished, well developed in no acute distress; borderline obese.   NECK: No JVD; No carotid bruits CARDIAC: Irregularly irregular rhythm with normal rate.  Normal S1, S2; no murmurs, rubs, gallops RESPIRATORY:  Clear to auscultation without rales, wheezing or rhonchi ; nonlabored, good air movement. ABDOMEN: Soft, non-tender, non-distended EXTREMITIES:  No edema; No deformity      ASSESSMENT AND PLAN: .    Problem List Items Addressed This Visit       Cardiology Problems   Essential  hypertension (Chronic)   Well-controlled blood pressure.  She remains on diltiazem  240 mg daily       Paroxysmal atrial fibrillation (HCC) - Primary (Chronic)   Episodes occur maybe once or twice a week but usually only twice a month, lasting up to an hour, not significantly limiting activities. Managed with flecainide  and Xarelto . Current management adequate as episodes are short and not overly symptomatic. Discussed flecainide 's role in reducing duration and frequency. More aggressive treatments like ablation deemed unnecessary due to lack of significant symptoms. - Continue flecainide  100 mg twice daily with her control along with diltiazem  240 mg daily for rate control. - Continue Xarelto  20 mg daily. - Monitor for increased frequency or severity of symptoms. - Schedule follow-up in one year with an option for a six-month virtual check-in.      Relevant Orders  EKG 12-Lead (Completed)     Other   Encounter for monitoring flecainide  therapy (Chronic)   On longstanding dose of flecainide  100 mg twice daily. Doing well with no signs or symptoms of chronotropic incompetence.  She is very active and exercise walking without any difficulty No signs symptoms of any other arrhythmias than intermittent atrial fibrillation.      Relevant Orders   EKG 12-Lead (Completed)   Long term current use of anticoagulant therapy (Chronic)   Tolerating Xarelto  without any major difficulties.  No bleeding issues. -Okay to hold Xarelto  2 to 3 days preop for surgeries or procedures. No need for bridging          Follow-Up: Return in about 1 year (around 11/15/2024) for Routine follow up with me -> schedule same day as her husband Ubaldo.     Signed, Alm MICAEL Clay, MD, MS Alm Clay, M.D., M.S. Interventional Cardiologist  [facility] Pager # [phone]

## 2023-11-16 NOTE — Assessment & Plan Note (Addendum)
 On longstanding dose of flecainide  100 mg twice daily. Doing well with no signs or symptoms of chronotropic incompetence.  She is very active and exercise walking without any difficulty No signs symptoms of any other arrhythmias than intermittent atrial fibrillation.

## 2023-11-16 NOTE — Assessment & Plan Note (Signed)
 Episodes occur maybe once or twice a week but usually only twice a month, lasting up to an hour, not significantly limiting activities. Managed with flecainide  and Xarelto . Current management adequate as episodes are short and not overly symptomatic. Discussed flecainide 's role in reducing duration and frequency. More aggressive treatments like ablation deemed unnecessary due to lack of significant symptoms. - Continue flecainide  100 mg twice daily with her control along with diltiazem  240 mg daily for rate control. - Continue Xarelto  20 mg daily. - Monitor for increased frequency or severity of symptoms. - Schedule follow-up in one year with an option for a six-month virtual check-in.

## 2023-11-16 NOTE — Assessment & Plan Note (Signed)
 Tolerating Xarelto  without any major difficulties.  No bleeding issues. -Okay to hold Xarelto  2 to 3 days preop for surgeries or procedures. No need for bridging

## 2024-01-09 DIAGNOSIS — E7800 Pure hypercholesterolemia, unspecified: Secondary | ICD-10-CM | POA: Diagnosis not present

## 2024-01-09 DIAGNOSIS — I480 Paroxysmal atrial fibrillation: Secondary | ICD-10-CM | POA: Diagnosis not present

## 2024-01-09 DIAGNOSIS — E559 Vitamin D deficiency, unspecified: Secondary | ICD-10-CM | POA: Diagnosis not present

## 2024-01-27 ENCOUNTER — Other Ambulatory Visit: Payer: Self-pay | Admitting: Cardiology

## 2024-01-30 ENCOUNTER — Other Ambulatory Visit: Payer: Self-pay | Admitting: Cardiology

## 2024-01-30 DIAGNOSIS — I480 Paroxysmal atrial fibrillation: Secondary | ICD-10-CM

## 2024-01-30 NOTE — Telephone Encounter (Signed)
 Prescription refill request for Xarelto  received.  Indication:afib Last office visit:10/25 Weight:82.6  kg Age:73 Scr:0.92  1/25 CrCl:72.08  ml/min  Prescription refilled
